# Patient Record
Sex: Female | Born: 1970
Health system: Southern US, Community
[De-identification: ages and names within clinical notes are randomized; demographics above are authoritative.]

## PROBLEM LIST (undated history)

## (undated) DIAGNOSIS — A64 Unspecified sexually transmitted disease: Secondary | ICD-10-CM

## (undated) DIAGNOSIS — R112 Nausea with vomiting, unspecified: Secondary | ICD-10-CM

## (undated) DIAGNOSIS — R7309 Other abnormal glucose: Secondary | ICD-10-CM

## (undated) DIAGNOSIS — K219 Gastro-esophageal reflux disease without esophagitis: Secondary | ICD-10-CM

## (undated) DIAGNOSIS — D649 Anemia, unspecified: Secondary | ICD-10-CM

## (undated) DIAGNOSIS — IMO0001 Reserved for inherently not codable concepts without codable children: Secondary | ICD-10-CM

## (undated) DIAGNOSIS — Z9889 Other specified postprocedural states: Secondary | ICD-10-CM

## (undated) DIAGNOSIS — E119 Type 2 diabetes mellitus without complications: Secondary | ICD-10-CM

## (undated) DIAGNOSIS — E669 Obesity, unspecified: Secondary | ICD-10-CM

## (undated) DIAGNOSIS — A630 Anogenital (venereal) warts: Secondary | ICD-10-CM

## (undated) DIAGNOSIS — F419 Anxiety disorder, unspecified: Secondary | ICD-10-CM

## (undated) DIAGNOSIS — J189 Pneumonia, unspecified organism: Secondary | ICD-10-CM

## (undated) HISTORY — DX: Unspecified sexually transmitted disease: A64

## (undated) HISTORY — DX: Reserved for inherently not codable concepts without codable children: IMO0001

## (undated) HISTORY — DX: Obesity, unspecified: E66.9

## (undated) HISTORY — DX: Gastro-esophageal reflux disease without esophagitis: K21.9

## (undated) HISTORY — DX: Anogenital (venereal) warts: A63.0

## (undated) HISTORY — DX: Anxiety disorder, unspecified: F41.9

## (undated) HISTORY — PX: OTHER SURGICAL HISTORY: SHX169

## (undated) HISTORY — DX: Other abnormal glucose: R73.09

## (undated) HISTORY — DX: Anemia, unspecified: D64.9

## (undated) HISTORY — PX: TONSILLECTOMY: SUR1361

---

## 1997-11-27 DIAGNOSIS — A64 Unspecified sexually transmitted disease: Secondary | ICD-10-CM

## 1997-11-27 HISTORY — DX: Unspecified sexually transmitted disease: A64

## 1998-03-31 DIAGNOSIS — E669 Obesity, unspecified: Secondary | ICD-10-CM

## 1998-03-31 HISTORY — DX: Obesity, unspecified: E66.9

## 1998-04-22 ENCOUNTER — Other Ambulatory Visit: Admission: RE | Admit: 1998-04-22 | Discharge: 1998-04-22 | Payer: Self-pay | Admitting: *Deleted

## 1998-05-31 HISTORY — PX: COLPOSCOPY: SHX161

## 1998-06-24 ENCOUNTER — Other Ambulatory Visit: Admission: RE | Admit: 1998-06-24 | Discharge: 1998-06-24 | Payer: Self-pay | Admitting: *Deleted

## 1998-10-01 ENCOUNTER — Other Ambulatory Visit: Admission: RE | Admit: 1998-10-01 | Discharge: 1998-10-01 | Payer: Self-pay | Admitting: *Deleted

## 1999-01-30 ENCOUNTER — Other Ambulatory Visit: Admission: RE | Admit: 1999-01-30 | Discharge: 1999-01-30 | Payer: Self-pay | Admitting: *Deleted

## 1999-04-24 ENCOUNTER — Emergency Department (HOSPITAL_COMMUNITY): Admission: EM | Admit: 1999-04-24 | Discharge: 1999-04-24 | Payer: Self-pay | Admitting: Emergency Medicine

## 1999-04-24 ENCOUNTER — Encounter: Admission: RE | Admit: 1999-04-24 | Discharge: 1999-04-24 | Payer: Self-pay | Admitting: *Deleted

## 1999-06-02 ENCOUNTER — Other Ambulatory Visit: Admission: RE | Admit: 1999-06-02 | Discharge: 1999-06-02 | Payer: Self-pay | Admitting: Obstetrics and Gynecology

## 1999-12-01 ENCOUNTER — Other Ambulatory Visit: Admission: RE | Admit: 1999-12-01 | Discharge: 1999-12-01 | Payer: Self-pay | Admitting: *Deleted

## 2000-05-24 ENCOUNTER — Emergency Department (HOSPITAL_COMMUNITY): Admission: EM | Admit: 2000-05-24 | Discharge: 2000-05-24 | Payer: Self-pay | Admitting: Emergency Medicine

## 2000-05-31 ENCOUNTER — Other Ambulatory Visit: Admission: RE | Admit: 2000-05-31 | Discharge: 2000-05-31 | Payer: Self-pay | Admitting: *Deleted

## 2001-07-06 ENCOUNTER — Other Ambulatory Visit: Admission: RE | Admit: 2001-07-06 | Discharge: 2001-07-06 | Payer: Self-pay | Admitting: *Deleted

## 2002-03-13 ENCOUNTER — Encounter: Payer: Self-pay | Admitting: Emergency Medicine

## 2002-03-13 ENCOUNTER — Emergency Department (HOSPITAL_COMMUNITY): Admission: EM | Admit: 2002-03-13 | Discharge: 2002-03-13 | Payer: Self-pay | Admitting: Emergency Medicine

## 2002-07-11 ENCOUNTER — Other Ambulatory Visit: Admission: RE | Admit: 2002-07-11 | Discharge: 2002-07-11 | Payer: Self-pay | Admitting: Obstetrics and Gynecology

## 2002-10-13 ENCOUNTER — Encounter: Payer: Self-pay | Admitting: *Deleted

## 2002-10-13 ENCOUNTER — Encounter: Admission: RE | Admit: 2002-10-13 | Discharge: 2002-10-13 | Payer: Self-pay | Admitting: *Deleted

## 2003-01-15 ENCOUNTER — Other Ambulatory Visit: Admission: RE | Admit: 2003-01-15 | Discharge: 2003-01-15 | Payer: Self-pay | Admitting: *Deleted

## 2003-07-17 ENCOUNTER — Other Ambulatory Visit: Admission: RE | Admit: 2003-07-17 | Discharge: 2003-07-17 | Payer: Self-pay | Admitting: Obstetrics and Gynecology

## 2003-07-18 ENCOUNTER — Ambulatory Visit (HOSPITAL_COMMUNITY): Admission: RE | Admit: 2003-07-18 | Discharge: 2003-07-18 | Payer: Self-pay | Admitting: *Deleted

## 2003-07-21 ENCOUNTER — Emergency Department (HOSPITAL_COMMUNITY): Admission: EM | Admit: 2003-07-21 | Discharge: 2003-07-21 | Payer: Self-pay | Admitting: Emergency Medicine

## 2003-08-27 ENCOUNTER — Ambulatory Visit (HOSPITAL_COMMUNITY): Admission: RE | Admit: 2003-08-27 | Discharge: 2003-08-27 | Payer: Self-pay | Admitting: Obstetrics and Gynecology

## 2003-10-16 ENCOUNTER — Ambulatory Visit (HOSPITAL_COMMUNITY): Admission: RE | Admit: 2003-10-16 | Discharge: 2003-10-16 | Payer: Self-pay | Admitting: Obstetrics and Gynecology

## 2003-12-07 ENCOUNTER — Inpatient Hospital Stay (HOSPITAL_COMMUNITY): Admission: AD | Admit: 2003-12-07 | Discharge: 2003-12-07 | Payer: Self-pay | Admitting: Obstetrics and Gynecology

## 2004-02-25 ENCOUNTER — Ambulatory Visit (HOSPITAL_COMMUNITY): Admission: RE | Admit: 2004-02-25 | Discharge: 2004-02-25 | Payer: Self-pay | Admitting: Obstetrics and Gynecology

## 2004-03-02 ENCOUNTER — Inpatient Hospital Stay (HOSPITAL_COMMUNITY): Admission: AD | Admit: 2004-03-02 | Discharge: 2004-03-04 | Payer: Self-pay | Admitting: Obstetrics and Gynecology

## 2004-03-07 ENCOUNTER — Encounter: Admission: RE | Admit: 2004-03-07 | Discharge: 2004-04-06 | Payer: Self-pay | Admitting: Obstetrics and Gynecology

## 2004-04-07 ENCOUNTER — Encounter: Admission: RE | Admit: 2004-04-07 | Discharge: 2004-05-07 | Payer: Self-pay | Admitting: Obstetrics and Gynecology

## 2004-07-17 ENCOUNTER — Other Ambulatory Visit: Admission: RE | Admit: 2004-07-17 | Discharge: 2004-07-17 | Payer: Self-pay | Admitting: Obstetrics and Gynecology

## 2005-08-04 ENCOUNTER — Other Ambulatory Visit: Admission: RE | Admit: 2005-08-04 | Discharge: 2005-08-04 | Payer: Self-pay | Admitting: Obstetrics and Gynecology

## 2005-08-17 ENCOUNTER — Encounter: Admission: RE | Admit: 2005-08-17 | Discharge: 2005-08-17 | Payer: Self-pay | Admitting: Obstetrics and Gynecology

## 2006-08-05 ENCOUNTER — Other Ambulatory Visit: Admission: RE | Admit: 2006-08-05 | Discharge: 2006-08-05 | Payer: Self-pay | Admitting: Obstetrics & Gynecology

## 2006-09-12 ENCOUNTER — Emergency Department (HOSPITAL_COMMUNITY): Admission: EM | Admit: 2006-09-12 | Discharge: 2006-09-12 | Payer: Self-pay | Admitting: Family Medicine

## 2007-06-10 ENCOUNTER — Encounter: Admission: RE | Admit: 2007-06-10 | Discharge: 2007-06-10 | Payer: Self-pay | Admitting: *Deleted

## 2007-08-08 ENCOUNTER — Other Ambulatory Visit: Admission: RE | Admit: 2007-08-08 | Discharge: 2007-08-08 | Payer: Self-pay | Admitting: Obstetrics and Gynecology

## 2008-08-08 ENCOUNTER — Other Ambulatory Visit: Admission: RE | Admit: 2008-08-08 | Discharge: 2008-08-08 | Payer: Self-pay | Admitting: Obstetrics and Gynecology

## 2008-10-07 ENCOUNTER — Emergency Department (HOSPITAL_COMMUNITY): Admission: EM | Admit: 2008-10-07 | Discharge: 2008-10-07 | Payer: Self-pay | Admitting: Family Medicine

## 2011-10-05 ENCOUNTER — Other Ambulatory Visit: Payer: Self-pay | Admitting: Obstetrics and Gynecology

## 2011-10-05 DIAGNOSIS — Z1231 Encounter for screening mammogram for malignant neoplasm of breast: Secondary | ICD-10-CM

## 2011-10-12 ENCOUNTER — Ambulatory Visit
Admission: RE | Admit: 2011-10-12 | Discharge: 2011-10-12 | Disposition: A | Discharge status: Home / Self Care | Payer: 59 | Source: Ambulatory Visit | Attending: Obstetrics and Gynecology | Admitting: Obstetrics and Gynecology

## 2011-10-12 DIAGNOSIS — Z1231 Encounter for screening mammogram for malignant neoplasm of breast: Secondary | ICD-10-CM

## 2011-12-14 LAB — HM PAP SMEAR: HM Pap smear: NORMAL

## 2012-09-26 ENCOUNTER — Other Ambulatory Visit: Payer: Self-pay | Admitting: Obstetrics and Gynecology

## 2012-09-26 DIAGNOSIS — Z1231 Encounter for screening mammogram for malignant neoplasm of breast: Secondary | ICD-10-CM

## 2012-10-17 ENCOUNTER — Ambulatory Visit
Admission: RE | Admit: 2012-10-17 | Discharge: 2012-10-17 | Disposition: A | Discharge status: Home / Self Care | Payer: 59 | Source: Ambulatory Visit | Attending: Obstetrics and Gynecology | Admitting: Obstetrics and Gynecology

## 2012-10-17 DIAGNOSIS — Z1231 Encounter for screening mammogram for malignant neoplasm of breast: Secondary | ICD-10-CM

## 2012-10-17 LAB — HM MAMMOGRAPHY: HM Mammogram: NORMAL

## 2012-12-20 ENCOUNTER — Encounter: Payer: Self-pay | Admitting: *Deleted

## 2012-12-22 ENCOUNTER — Ambulatory Visit (INDEPENDENT_AMBULATORY_CARE_PROVIDER_SITE_OTHER): Payer: 59 | Admitting: Nurse Practitioner

## 2012-12-22 ENCOUNTER — Encounter: Payer: Self-pay | Admitting: Nurse Practitioner

## 2012-12-22 ENCOUNTER — Ambulatory Visit (INDEPENDENT_AMBULATORY_CARE_PROVIDER_SITE_OTHER): Payer: 59 | Admitting: Obstetrics & Gynecology

## 2012-12-22 VITALS — BP 138/70 | HR 78 | Ht 69.0 in | Wt 390.8 lb

## 2012-12-22 DIAGNOSIS — N9089 Other specified noninflammatory disorders of vulva and perineum: Secondary | ICD-10-CM

## 2012-12-22 DIAGNOSIS — Z01419 Encounter for gynecological examination (general) (routine) without abnormal findings: Secondary | ICD-10-CM

## 2012-12-22 DIAGNOSIS — Z0000 Encounter for general adult medical examination without abnormal findings: Secondary | ICD-10-CM

## 2012-12-22 LAB — POCT URINALYSIS DIPSTICK
Leukocytes, UA: NEGATIVE
Spec Grav, UA: 1.015
Urobilinogen, UA: NEGATIVE
pH, UA: 7

## 2012-12-22 LAB — HEMOGLOBIN, FINGERSTICK: Hemoglobin, fingerstick: 12.4 g/dL (ref 12.0–16.0)

## 2012-12-22 NOTE — Patient Instructions (Signed)

## 2012-12-22 NOTE — Progress Notes (Signed)
42 y.o. Single Caucasain female G1P1 here for AEX with Shirlyn Goltz.  Several multiple lesions on inner thighs noted.  She has a history of skin tags but one area in particular is very sensitive to the patient.  I looked at it with Shirlyn Goltz and recommended a vulvar biopsy.    Procedure described to patient.  Informed consent obtained.  All questions answered before proceeding.    Procedure:  Area cleansed with Betadine.  Sterile technique used throughout procedure.  Skin anesthestized with Lidocaine 1% plain; 0.40mL. Lot#[id number], Exp 10/29/13.  Lesions grasped with single pickups and excised with sterile scissors.  One figure of eight suture of 4.0 vicryl used to close incision and provide hemostasis.  Adequate hemostasis noted.  Dressing was applied.     Specimen(s) labeled as right inner thigh and sent to pathology  Assessment:  Vulvar lesion     Plan:  Patient will be notified of pathology results via phone and additional recommendations will be made at that time.

## 2012-12-22 NOTE — Patient Instructions (Signed)

## 2012-12-22 NOTE — Progress Notes (Signed)
42 y.o. G1P1 Single Caucasian Fe here for annual exam.  Menses for 3.5 days. moderate to light, no cramps. Some PMS. Not sexually active.  She has noted an increase in irritation of the upper thighs that she thought was irritation of skin tags. Her ex husband was without a job first of year along with the furnace going out; and that caused some financial worries but he is working now.   Now furnace is fixed.  No LMP recorded.          Sexually active: no  The current method of family planning is none.    Exercising: no  The patient does not participate in regular exercise at present. Smoker:  no  Health Maintenance: Pap:  12/14/2011 normal with neg HR HPV  MMG:  10/17/2012 normal TDaP:  05/2011 Labs: Hgb-   reports that she has never smoked. She does not have any smokeless tobacco history on file.  Past Medical History  Diagnosis Date  . Obesity 03/1998  . STD (sexually transmitted disease) 11/27/97    Hx of Chlamydia    Past Surgical History  Procedure Laterality Date  . Colposcopy  05/1998    neg ECC, neg Bx    Current Outpatient Prescriptions  Medication Sig Dispense Refill  . Calcium-Magnesium-Vitamin D (CALCIUM 500 PO) Take by mouth.      . Cetirizine HCl 10 MG CAPS Take by mouth.      . Cholecalciferol (VITAMIN D) 2000 UNITS CAPS Take by mouth.      . Citalopram Hydrobromide (CELEXA PO) Take by mouth. NEED TO KNOW HER DOSAGE      . hydrochlorothiazide (HYDRODIURIL) 25 MG tablet Take 25 mg by mouth daily.      Marland Kitchen LORazepam (ATIVAN) 1 MG tablet Take 1 mg by mouth every 8 (eight) hours. PRN       No current facility-administered medications for this visit.    Family History  Problem Relation Age of Onset  . Heart disease Father   . Multiple births Maternal Grandmother   . Hypertension Maternal Grandmother   . Multiple births Paternal Grandmother   . Diabetes Paternal Grandmother   . Heart disease Paternal Grandfather     ROS:  Pertinent items are noted in HPI.   Otherwise, a comprehensive ROS was negative.  Exam:   There were no vitals taken for this visit.     Ht Readings from Last 3 Encounters:  No data found for Ht    General appearance: alert, cooperative and appears stated age Head: Normocephalic, without obvious abnormality, atraumatic Neck: no adenopathy, supple, symmetrical, trachea midline and thyroid normal to inspection and palpation Lungs: clear to auscultation bilaterally Breasts: normal appearance, no masses or tenderness Heart: regular rate and rhythm Abdomen: soft, non-tender; obese, no masses,  no organomegaly Extremities: extremities normal, atraumatic, no cyanosis or edema Skin: Skin color, texture, turgor normal. No rashes or lesions Lymph nodes: Cervical, supraclavicular, and axillary nodes normal. No abnormal inguinal nodes palpated Neurologic: Grossly normal   Pelvic: External genitalia:  multiple lesions that look like clusters of skin tags in the groin.  One area on the right is irritated and ? Condyloma.  Pt. Was seen with Dr. Hyacinth Meeker.              Urethra:  normal appearing urethra with no masses, tenderness or lesions              Bartholin's and Skene's: normal  Vagina: normal appearing vagina with normal color and discharge, no lesions              Cervix: anteverted              Pap taken: no Bimanual Exam:  Uterus:  normal limited with body habitus.              Adnexa: no mass, fullness, tenderness               Rectovaginal: Confirms               Anus:  normal sphincter tone, no lesions  A:  Well Woman with normal exam  Morbid obesity  Skin tags inguinal area - that needs a biopsy for correct diagnosis  P:   Mammogram due 2/15  pap smear as per guidelines  Dr. Hyacinth Meeker did biopsy of lesion and will follow with results.  return annually or prn  An After Visit Summary was printed and given to the patient.  Patient seen personally.  Note reviewed.  Agree with plan of care.  MSM

## 2012-12-26 LAB — IPS CERVICAL/ECC/EMB/VULVAR/VAGINAL BIOPSY

## 2013-01-04 ENCOUNTER — Ambulatory Visit (INDEPENDENT_AMBULATORY_CARE_PROVIDER_SITE_OTHER): Payer: 59 | Admitting: Obstetrics & Gynecology

## 2013-01-04 VITALS — BP 128/80 | HR 80 | Resp 20

## 2013-01-04 DIAGNOSIS — A630 Anogenital (venereal) warts: Secondary | ICD-10-CM

## 2013-01-05 ENCOUNTER — Encounter: Payer: Self-pay | Admitting: Obstetrics & Gynecology

## 2013-01-05 NOTE — Patient Instructions (Signed)
We will call and schedule your procedures.

## 2013-01-05 NOTE — Progress Notes (Signed)
42 y.o. SWF here for discussion of pathology results and to discuss plan.  Patient seen here 12/22/12 by NP, Shirlyn Goltz, for AEX.  Patient reported area of significant irritation on right inner thigh.  I was asked to look at it.  I thought it was an inflamed skin tag as the lesion is located in an area where her thighs rub with ambulation due to obesity.  Lesion was excised and sent to pathology.  Results showed condyloma.  Patient here for discussion.  Patient and I discussed results.  She has many questions as she has not been sexually active in about two years and her HR HPV testing on Pap was negative.  Discussed with pt difference between HR HPV and low risk HPV as well as natural history of disease.  All questions answered.  Treatment discussion.  Due to her obesity and number of inner thigh lesions present, I feel treatment may be difficult and require multiple visits.  Aldara, cryo, TCA, excisions, and laser ablation of lesions discussed.  Patient aware I will not be able to make inner thighs completely normal due to the skin thickening from inner thigh rubbing.  Voices understanding.    O: Healthy obese WD,WN female Affect: normal Skin: right inner thigh well healed.  No suture present.  A:Condyloma  P: Pt will return for cryo to inner thigh lesions and then aldara treatment.  Patient in agreement with plan.  ~15 minutes spent with patient >50% of time was in face to face discussion of above.

## 2013-07-06 ENCOUNTER — Other Ambulatory Visit: Payer: Self-pay

## 2013-07-15 ENCOUNTER — Emergency Department (HOSPITAL_COMMUNITY)
Admission: EM | Admit: 2013-07-15 | Discharge: 2013-07-15 | Disposition: A | Discharge status: Home / Self Care | Payer: 59 | Source: Home / Self Care | Attending: Emergency Medicine | Admitting: Emergency Medicine

## 2013-07-15 ENCOUNTER — Encounter (HOSPITAL_COMMUNITY): Payer: Self-pay | Admitting: Emergency Medicine

## 2013-07-15 DIAGNOSIS — M6790 Unspecified disorder of synovium and tendon, unspecified site: Secondary | ICD-10-CM

## 2013-07-15 DIAGNOSIS — M67971 Unspecified disorder of synovium and tendon, right ankle and foot: Secondary | ICD-10-CM

## 2013-07-15 MED ORDER — DICLOFENAC SODIUM 75 MG PO TBEC: 75.0000 mg | DELAYED_RELEASE_TABLET | Freq: Two times a day (BID) | ORAL | Status: DC

## 2013-07-15 NOTE — ED Provider Notes (Signed)
CSN: [id number]     Arrival date & time 07/15/13  1127 History   First MD Initiated Contact with Patient 07/15/13 1317     Chief Complaint  Patient presents with  . Foot Pain    Right    (Consider location/radiation/quality/duration/timing/severity/associated sxs/prior Treatment) HPI Patient is a 42 yo F presenting with right posterior heel pain. She states it has been going on for a few months, unchanged. Worse with walking or putting pressure on foot. Pain is around achilles tendon, does not radiate into heel or bottom of foot. Pain is present at all times. She does not notice a difference based on which shoes she is has on. She has been doing full motion stretches for ankle/achilles and that made it a lot worse. Ice, heat, rest, ibuprofen have been tried but did not help. Nothing makes it better. Never had anything like this before.   Boneta Lucks at [facility] is PCP.  Past Medical History  Diagnosis Date  . Obesity 03/1998  . STD (sexually transmitted disease) 11/27/97    Hx of Chlamydia   Past Surgical History  Procedure Laterality Date  . Colposcopy  05/1998    neg ECC, neg Bx   Family History  Problem Relation Age of Onset  . Heart disease Father   . Multiple births Maternal Grandmother   . Hypertension Maternal Grandmother   . Multiple births Paternal Grandmother   . Diabetes Paternal Grandmother   . Heart disease Paternal Grandfather    History  Substance Use Topics  . Smoking status: Never Smoker   . Smokeless tobacco: Never Used  . Alcohol Use: No   OB History   Grav Para Term Preterm Abortions TAB SAB Ect Mult Living   1 1             Review of Systems  Constitutional: Negative for fever and chills.  HENT: Negative for congestion.   Eyes: Negative for visual disturbance.  Respiratory: Negative for cough and shortness of breath.   Cardiovascular: Negative for chest pain and leg swelling.  Gastrointestinal: Negative for abdominal pain.  Genitourinary:  Negative for dysuria.  Musculoskeletal: Positive for arthralgias, gait problem and myalgias.  Skin: Negative for rash.  Neurological: Negative for headaches.    Allergies  Review of patient's allergies indicates no known allergies.  Home Medications   Current Outpatient Rx  Name  Route  Sig  Dispense  Refill  . Calcium-Magnesium-Vitamin D (CALCIUM 500 PO)   Oral   Take by mouth.         . Cetirizine HCl 10 MG CAPS   Oral   Take 10 mg by mouth as needed.          . Cholecalciferol (VITAMIN D) 2000 UNITS CAPS   Oral   Take by mouth.         . Citalopram Hydrobromide (CELEXA PO)   Oral   Take 40 mg by mouth daily.          . hydrochlorothiazide (HYDRODIURIL) 25 MG tablet   Oral   Take 25 mg by mouth daily.         Marland Kitchen LORazepam (ATIVAN) 1 MG tablet   Oral   Take 1 mg by mouth every 8 (eight) hours. PRN         . diclofenac (VOLTAREN) 75 MG EC tablet   Oral   Take 1 tablet (75 mg total) by mouth 2 (two) times daily.   30 tablet   0  LMP 07/08/2013 Physical Exam  Constitutional: She is oriented to person, place, and time. She appears well-developed and well-nourished. No distress.  Morbidly obese female  HENT:  Head: Normocephalic and atraumatic.  Neck: Normal range of motion.  Cardiovascular: Normal rate, regular rhythm and normal heart sounds.   Pulmonary/Chest: Effort normal and breath sounds normal. No respiratory distress. She has no wheezes.  Abdominal: Soft. She exhibits no distension.  Musculoskeletal: Normal range of motion. She exhibits no edema.       Left ankle: She exhibits normal pulse. Achilles tendon exhibits pain (TTP of insertion site.). Achilles tendon exhibits no defect.  Neurological: She is alert and oriented to person, place, and time.  Skin: Skin is warm and dry. No rash noted.  Psychiatric: She has a normal mood and affect.    ED Course  Procedures (including critical care time) Labs Review Labs Reviewed - No data to  display Imaging Review No results found.   MDM   1. Achilles tendon disorder, right    No known injury. Encouraged weight loss. Given Rx for Diclofenac 75mg  BID for antiinflammatory Eccentric exercises Ice to area Heel cups for shoes F/u at sports medicine clinic for better evaluation.  Hilarie Fredrickson, MD 07/15/13 1420

## 2013-07-15 NOTE — ED Notes (Signed)
C/o right heel pain x a "few" months , has tried ice, heat and rest

## 2013-07-17 ENCOUNTER — Ambulatory Visit (INDEPENDENT_AMBULATORY_CARE_PROVIDER_SITE_OTHER): Payer: 59 | Admitting: Family Medicine

## 2013-07-17 VITALS — BP 142/87 | HR 77 | Ht 69.0 in | Wt 380.0 lb

## 2013-07-17 DIAGNOSIS — M25579 Pain in unspecified ankle and joints of unspecified foot: Secondary | ICD-10-CM

## 2013-07-17 DIAGNOSIS — M25571 Pain in right ankle and joints of right foot: Secondary | ICD-10-CM

## 2013-07-17 DIAGNOSIS — M7660 Achilles tendinitis, unspecified leg: Secondary | ICD-10-CM

## 2013-07-17 DIAGNOSIS — M7661 Achilles tendinitis, right leg: Secondary | ICD-10-CM

## 2013-07-17 MED ORDER — NITROGLYCERIN 0.2 MG/HR TD PT24: MEDICATED_PATCH | TRANSDERMAL | Status: DC

## 2013-07-17 NOTE — ED Provider Notes (Signed)
Medical screening examination/treatment/procedure(s) were performed by a resident physician or non-physician practitioner and as the supervising physician I was immediately available for consultation/collaboration.  Levette Paulick, MD    Matalie Romberger S Zalyn Amend, MD 07/17/13 0743 

## 2013-07-17 NOTE — Patient Instructions (Signed)
You have achilles tendinitis. Tylenol or aleve as needed for pain Lowering/raise on a step exercises 3 x 10 once or twice a day - two feet first then one (start by doing 3 sets of 5 on level ground and work your way up as we discussed). Can add heel walks, toe walks forward and backward as well Ice bucket 10-15 minutes at end of day - can ice 3-4 times a day. Avoid uneven ground, hills as much as possible. Heel lifts in all shoes.  Avoid barefoot walking. Consider Dr. Jari Sportsman active series insoles with the lifts we provided. Consider Physical therapy Nitro patches 1/4 of 0.2mg /hr patch over affected achilles and change daily Follow up with me in 1 month.

## 2013-07-18 ENCOUNTER — Encounter: Payer: Self-pay | Admitting: Family Medicine

## 2013-07-18 DIAGNOSIS — M7661 Achilles tendinitis, right leg: Secondary | ICD-10-CM | POA: Insufficient documentation

## 2013-07-18 NOTE — Progress Notes (Signed)
Patient ID: Debra French, female   DOB: 06/01/1971, 42 y.o.   MRN: [id number]  PCP: No PCP Per Patient  Subjective:   HPI: Patient is a 42 y.o. female here for right heel pain.  Patient denies known injury. Patient reports she's had off and on right posterior heel pain for a year. If walks a lot will get severe pain here for 3-4 days afterwards. Tried heat, ice, resting, ibuprofen only.  Past Medical History  Diagnosis Date  . Obesity 03/1998  . STD (sexually transmitted disease) 11/27/97    Hx of Chlamydia    Current Outpatient Prescriptions on File Prior to Visit  Medication Sig Dispense Refill  . Calcium-Magnesium-Vitamin D (CALCIUM 500 PO) Take by mouth.      . Cholecalciferol (VITAMIN D) 2000 UNITS CAPS Take by mouth.      . Citalopram Hydrobromide (CELEXA PO) Take 40 mg by mouth daily.       Marland Kitchen LORazepam (ATIVAN) 1 MG tablet Take 1 mg by mouth every 8 (eight) hours. PRN      . Cetirizine HCl 10 MG CAPS Take 10 mg by mouth as needed.       . diclofenac (VOLTAREN) 75 MG EC tablet Take 1 tablet (75 mg total) by mouth 2 (two) times daily.  30 tablet  0  . hydrochlorothiazide (HYDRODIURIL) 25 MG tablet Take 25 mg by mouth daily.       No current facility-administered medications on file prior to visit.    Past Surgical History  Procedure Laterality Date  . Colposcopy  05/1998    neg ECC, neg Bx    No Known Allergies  History   Social History  . Marital Status: Single    Spouse Name: N/A    Number of Children: N/A  . Years of Education: N/A   Occupational History  . Not on file.   Social History Main Topics  . Smoking status: Never Smoker   . Smokeless tobacco: Never Used  . Alcohol Use: No  . Drug Use: No  . Sexual Activity: No   Other Topics Concern  . Not on file   Social History Narrative  . No narrative on file    Family History  Problem Relation Age of Onset  . Heart disease Father   . Hyperlipidemia Father   . Multiple births Maternal  Grandmother   . Hypertension Maternal Grandmother   . Multiple births Paternal Grandmother   . Diabetes Paternal Grandmother   . Heart disease Paternal Grandfather   . Heart attack Paternal Grandfather   . Hyperlipidemia Sister     BP 142/87  Pulse 77  Ht 5\' 9"  (1.753 m)  Wt 380 lb (172.367 kg)  BMI 56.09 kg/m2  LMP 07/08/2013  Review of Systems: See HPI above.    Objective:  Physical Exam:  Gen: NAD  Right foot/ankle: No gross deformity, swelling, ecchymoses FROM with pain on passive dorsiflexion.  Pain with resisted plantarflexion. TTP achilles just proximal to and at insertion on calcaneus. Negative ant drawer and talar tilt.   Negative syndesmotic compression. Thompsons test negative. NV intact distally.  MSK u/s: calcifications with thickening to over 0.8cm at insertion of achilles tendon on calcaneus.    Assessment & Plan:  1. Right achilles tendinopathy - start with home exercise program which was reviewed today.  Heel lifts provided.  Given length of symptoms will add nitro patches at this time (discussed headaches, adhesive reaction).  Icing, avoid uneven ground and barefoot walking.  Consider formal physical therapy.  F/u in 1 month.

## 2013-07-18 NOTE — Assessment & Plan Note (Signed)
start with home exercise program which was reviewed today.  Heel lifts provided.  Given length of symptoms will add nitro patches at this time (discussed headaches, adhesive reaction).  Icing, avoid uneven ground and barefoot walking.  Consider formal physical therapy.  F/u in 1 month.

## 2013-08-16 ENCOUNTER — Encounter: Payer: Self-pay | Admitting: Family Medicine

## 2013-08-16 ENCOUNTER — Ambulatory Visit (INDEPENDENT_AMBULATORY_CARE_PROVIDER_SITE_OTHER): Payer: 59 | Admitting: Family Medicine

## 2013-08-16 VITALS — BP 166/96 | HR 79 | Ht 69.0 in | Wt 385.0 lb

## 2013-08-16 DIAGNOSIS — M7660 Achilles tendinitis, unspecified leg: Secondary | ICD-10-CM

## 2013-08-16 DIAGNOSIS — M7661 Achilles tendinitis, right leg: Secondary | ICD-10-CM

## 2013-08-16 NOTE — Patient Instructions (Signed)
Tylenol or aleve as needed for pain Continue step exercise, stretching every day. Heel walks, toe walks forward and backward as well Ice bucket 10-15 minutes at end of day - can ice 3-4 times a day. Avoid uneven ground, hills as much as possible. Heel lifts in all shoes.  Avoid barefoot walking. Increase nitro patches to 1/2 a patch and change daily. Consider Physical therapy Consider custom orthotics - call and schedule appointment specifically for orthotics if you want to go ahead with this (usually takes about 45 minutes).

## 2013-08-18 ENCOUNTER — Encounter: Payer: Self-pay | Admitting: Family Medicine

## 2013-08-18 NOTE — Progress Notes (Signed)
Patient ID: Debra French, female   DOB: 08-Oct-1970, 42 y.o.   MRN: [id number]  PCP: No PCP Per Patient  Subjective:   HPI: Patient is a 42 y.o. female here for right heel pain.  11/17: Patient denies known injury. Patient reports she's had off and on right posterior heel pain for a year. If walks a lot will get severe pain here for 3-4 days afterwards. Tried heat, ice, resting, ibuprofen only.  12/17: Continues with 6/10 level pain of posterior heel. Doing HEP, icing, using heel lifts, nitro. Taking ibuprofen.  Past Medical History  Diagnosis Date  . Obesity 03/1998  . STD (sexually transmitted disease) 11/27/97    Hx of Chlamydia    Current Outpatient Prescriptions on File Prior to Visit  Medication Sig Dispense Refill  . Calcium-Magnesium-Vitamin D (CALCIUM 500 PO) Take by mouth.      . Cetirizine HCl 10 MG CAPS Take 10 mg by mouth as needed.       . Cholecalciferol (VITAMIN D) 2000 UNITS CAPS Take by mouth.      . Citalopram Hydrobromide (CELEXA PO) Take 40 mg by mouth daily.       . diclofenac (VOLTAREN) 75 MG EC tablet Take 1 tablet (75 mg total) by mouth 2 (two) times daily.  30 tablet  0  . hydrochlorothiazide (HYDRODIURIL) 25 MG tablet Take 25 mg by mouth daily.      Marland Kitchen LORazepam (ATIVAN) 1 MG tablet Take 1 mg by mouth every 8 (eight) hours. PRN      . nitroGLYCERIN (NITRODUR - DOSED IN MG/24 HR) 0.2 mg/hr patch Apply 1/4 patch to affected achilles and change daily  30 patch  1   No current facility-administered medications on file prior to visit.    Past Surgical History  Procedure Laterality Date  . Colposcopy  05/1998    neg ECC, neg Bx    No Known Allergies  History   Social History  . Marital Status: Single    Spouse Name: N/A    Number of Children: N/A  . Years of Education: N/A   Occupational History  . Not on file.   Social History Main Topics  . Smoking status: Never Smoker   . Smokeless tobacco: Never Used  . Alcohol Use: No  .  Drug Use: No  . Sexual Activity: No   Other Topics Concern  . Not on file   Social History Narrative  . No narrative on file    Family History  Problem Relation Age of Onset  . Heart disease Father   . Hyperlipidemia Father   . Multiple births Maternal Grandmother   . Hypertension Maternal Grandmother   . Multiple births Paternal Grandmother   . Diabetes Paternal Grandmother   . Heart disease Paternal Grandfather   . Heart attack Paternal Grandfather   . Hyperlipidemia Sister     BP 166/96  Pulse 79  Ht 5\' 9"  (1.753 m)  Wt 385 lb (174.635 kg)  BMI 56.83 kg/m2  Review of Systems: See HPI above.    Objective:  Physical Exam:  Gen: NAD  Right foot/ankle: No gross deformity, swelling, ecchymoses FROM with pain on passive dorsiflexion.  Pain with resisted plantarflexion. TTP achilles just proximal to and at insertion on calcaneus. Negative ant drawer and talar tilt.   Negative syndesmotic compression. Thompsons test negative. NV intact distally.    Assessment & Plan:  1. Right achilles tendinopathy - Continue home exercises.  Increase nitro to 1/2 patch.  Continue heel lifts, icing, nsaids as needed.  Consider custom orthotics and physical therapy.

## 2013-08-18 NOTE — Assessment & Plan Note (Signed)
Continue home exercises.  Increase nitro to 1/2 patch.  Continue heel lifts, icing, nsaids as needed.  Consider custom orthotics and physical therapy.

## 2013-08-28 ENCOUNTER — Encounter: Payer: Self-pay | Admitting: *Deleted

## 2013-09-27 ENCOUNTER — Other Ambulatory Visit: Payer: Self-pay

## 2013-09-27 DIAGNOSIS — Z1231 Encounter for screening mammogram for malignant neoplasm of breast: Secondary | ICD-10-CM

## 2013-10-23 ENCOUNTER — Ambulatory Visit
Admission: RE | Admit: 2013-10-23 | Discharge: 2013-10-23 | Disposition: A | Discharge status: Home / Self Care | Payer: 59 | Source: Ambulatory Visit

## 2013-10-23 DIAGNOSIS — Z1231 Encounter for screening mammogram for malignant neoplasm of breast: Secondary | ICD-10-CM

## 2013-11-01 ENCOUNTER — Encounter: Payer: Self-pay | Admitting: Obstetrics & Gynecology

## 2013-11-09 ENCOUNTER — Telehealth: Payer: Self-pay | Admitting: Nurse Practitioner

## 2013-11-09 NOTE — Telephone Encounter (Signed)
Patient has a question about a bill.

## 2013-12-26 ENCOUNTER — Encounter: Payer: Self-pay | Admitting: Nurse Practitioner

## 2013-12-26 ENCOUNTER — Ambulatory Visit (INDEPENDENT_AMBULATORY_CARE_PROVIDER_SITE_OTHER): Payer: 59 | Admitting: Nurse Practitioner

## 2013-12-26 VITALS — BP 126/82 | HR 72 | Ht 68.75 in | Wt 382.0 lb

## 2013-12-26 DIAGNOSIS — Z01419 Encounter for gynecological examination (general) (routine) without abnormal findings: Secondary | ICD-10-CM

## 2013-12-26 DIAGNOSIS — Z0000 Encounter for general adult medical examination without abnormal findings: Secondary | ICD-10-CM

## 2013-12-26 LAB — HEMOGLOBIN, FINGERSTICK: Hemoglobin, fingerstick: 12.8 g/dL (ref 12.0–16.0)

## 2013-12-26 NOTE — Progress Notes (Signed)
Patient ID: Debra French, female   DOB: Aug 10, 1971, 43 y.o.   MRN: [id number] 43 y.o. G1P1 Single Caucasian Fe here for annual exam. Menses is regular last about 5 days.  No cramps or PMS.  Not SA for 2 years.   Patient's last menstrual period was 12/23/2013.          Sexually active: no  The current method of family planning is abstinence.    Exercising: no  The patient does not participate in regular exercise at present. Smoker:  no  Health Maintenance: Pap:  12/14/11, WNL, neg HR HPV MMG:  10/23/13, Bi-Rads 1: negative TDaP:  05/01/10 Labs:  HB:  12.8 Urine:  Unable to void   reports that she has never smoked. She has never used smokeless tobacco. She reports that she does not drink alcohol or use illicit drugs.  Past Medical History  Diagnosis Date  . Obesity 03/1998  . STD (sexually transmitted disease) 11/27/97    Hx of Chlamydia    Past Surgical History  Procedure Laterality Date  . Colposcopy  05/1998    neg ECC, neg Bx    Current Outpatient Prescriptions  Medication Sig Dispense Refill  . Calcium-Magnesium-Vitamin D (CALCIUM 500 PO) Take by mouth.      . Cetirizine HCl 10 MG CAPS Take 10 mg by mouth as needed.       . Cholecalciferol (VITAMIN D-3) 5000 UNITS TABS Take 1 tablet by mouth daily.      . Citalopram Hydrobromide (CELEXA PO) Take 40 mg by mouth daily.       . famotidine (PEPCID) 20 MG tablet Take 20 mg by mouth daily.      . ferrous sulfate 325 (65 FE) MG EC tablet Take 325 mg by mouth daily with breakfast.      . LORazepam (ATIVAN) 1 MG tablet Take 1 mg by mouth every 8 (eight) hours. PRN      . Multiple Vitamin (MULTIVITAMIN) tablet Take 1 tablet by mouth daily.      . phenylephrine (SUDAFED PE) 10 MG TABS tablet Take 10 mg by mouth daily.      . nitroGLYCERIN (NITRODUR - DOSED IN MG/24 HR) 0.2 mg/hr patch Apply 1/4 patch to affected achilles and change daily  30 patch  1   No current facility-administered medications for this visit.    Family  History  Problem Relation Age of Onset  . Heart disease Father   . Hyperlipidemia Father   . Multiple births Maternal Grandmother   . Hypertension Maternal Grandmother   . Multiple births Paternal Grandmother   . Diabetes Paternal Grandmother   . Heart disease Paternal Grandfather   . Heart attack Paternal Grandfather   . Hyperlipidemia Sister     ROS:  Pertinent items are noted in HPI.  Otherwise, a comprehensive ROS was negative.  Exam:   BP 126/82  Pulse 72  Ht 5' 8.75" (1.746 m)  Wt 382 lb (173.274 kg)  BMI 56.84 kg/m2  LMP 12/23/2013 Height: 5' 8.75" (174.6 cm)  Ht Readings from Last 3 Encounters:  12/26/13 5' 8.75" (1.746 m)  08/16/13 5\' 9"  (1.753 m)  07/17/13 5\' 9"  (1.753 m)    General appearance: alert, cooperative and appears stated age Head: Normocephalic, without obvious abnormality, atraumatic Neck: no adenopathy, supple, symmetrical, trachea midline and thyroid normal to inspection and palpation Lungs: clear to auscultation bilaterally Breasts: normal appearance, no masses or tenderness Heart: regular rate and rhythm Abdomen: soft, non-tender; no masses,  no organomegaly Extremities: extremities normal, atraumatic, no cyanosis or edema Skin: Skin color, texture, turgor normal. No rashes or lesions Lymph nodes: Cervical, supraclavicular, and axillary nodes normal. No abnormal inguinal nodes palpated Neurologic: Grossly normal   Pelvic: External genitalia:  no lesions              Urethra:  normal appearing urethra with no masses, tenderness or lesions              Bartholin's and Skene's: normal                 Vagina: normal appearing vagina with normal color and moderate menses discharge, no lesions              Cervix: anteverted              Pap taken: no Bimanual Exam:  Uterus:  normal size, contour, position, consistency, mobility, non-tender, limited secondary to body habitus              Adnexa: no mass, fullness, tenderness                Rectovaginal: declines               Anus:  declines  A:  Well Woman with normal exam  Regular menses - not SA  P:   Reviewed health and wellness pertinent to exam  Pap smear not taken today  Mammogram due 2/16  Counseled on breast self exam, mammography screening, adequate intake of calcium and vitamin D, diet and exercise, Kegel's exercises return annually or prn  An After Visit Summary was printed and given to the patient.

## 2013-12-26 NOTE — Patient Instructions (Signed)

## 2013-12-28 NOTE — Progress Notes (Signed)
Encounter reviewed by Dr. Avis Tirone Silva.  

## 2014-06-15 ENCOUNTER — Other Ambulatory Visit: Payer: Self-pay

## 2014-07-02 ENCOUNTER — Encounter: Payer: Self-pay | Admitting: Nurse Practitioner

## 2014-07-30 ENCOUNTER — Ambulatory Visit
Admission: RE | Admit: 2014-07-30 | Discharge: 2014-07-30 | Disposition: A | Discharge status: Home / Self Care | Payer: 59 | Source: Ambulatory Visit | Attending: Family | Admitting: Family

## 2014-07-30 ENCOUNTER — Other Ambulatory Visit: Payer: Self-pay | Admitting: Family

## 2014-07-30 DIAGNOSIS — R05 Cough: Secondary | ICD-10-CM

## 2014-07-30 DIAGNOSIS — R509 Fever, unspecified: Secondary | ICD-10-CM

## 2014-07-30 DIAGNOSIS — R059 Cough, unspecified: Secondary | ICD-10-CM

## 2014-09-28 ENCOUNTER — Other Ambulatory Visit: Payer: Self-pay

## 2014-09-28 DIAGNOSIS — Z1231 Encounter for screening mammogram for malignant neoplasm of breast: Secondary | ICD-10-CM

## 2014-10-29 ENCOUNTER — Ambulatory Visit
Admission: RE | Admit: 2014-10-29 | Discharge: 2014-10-29 | Disposition: A | Discharge status: Home / Self Care | Payer: 59 | Source: Ambulatory Visit

## 2014-10-29 DIAGNOSIS — Z1231 Encounter for screening mammogram for malignant neoplasm of breast: Secondary | ICD-10-CM

## 2014-12-28 ENCOUNTER — Ambulatory Visit: Payer: 59 | Admitting: Nurse Practitioner

## 2015-02-28 ENCOUNTER — Ambulatory Visit (INDEPENDENT_AMBULATORY_CARE_PROVIDER_SITE_OTHER): Payer: Commercial Managed Care - HMO | Admitting: Nurse Practitioner

## 2015-02-28 ENCOUNTER — Encounter: Payer: Self-pay | Admitting: Nurse Practitioner

## 2015-02-28 VITALS — BP 120/82 | HR 92 | Resp 16 | Ht 68.25 in | Wt 374.0 lb

## 2015-02-28 DIAGNOSIS — Z01419 Encounter for gynecological examination (general) (routine) without abnormal findings: Secondary | ICD-10-CM

## 2015-02-28 DIAGNOSIS — Z0000 Encounter for general adult medical examination without abnormal findings: Secondary | ICD-10-CM | POA: Diagnosis not present

## 2015-02-28 NOTE — Progress Notes (Signed)
Encounter reviewed by Dr. Malak Orantes Amundson C. Silva.  

## 2015-02-28 NOTE — Progress Notes (Signed)
Patient ID: Debra French, female   DOB: October 31, 1970, 44 y.o.   MRN: [id number] 44 y.o. G1P1 Single  Caucasian Fe here for annual exam.  Menses now at 4 days.  Heavy for 3, cramps for 3 days. Son away at camp.  Bad case of poison ivy and had a steroid injection.  Patient's last menstrual period was 02/17/2015.          Sexually active: No.  The current method of family planning is none.    Exercising: No.   Smoker:  no  Health Maintenance: Pap:  12-14-11 WNL MMG:  10-30-14 WNL BI Rads 1 TDaP:  05/01/2010 Labs: PCP does labs    reports that she has never smoked. She has never used smokeless tobacco. She reports that she does not drink alcohol or use illicit drugs.  Past Medical History  Diagnosis Date  . Obesity 03/1998  . STD (sexually transmitted disease) 11/27/97    Hx of Chlamydia    Past Surgical History  Procedure Laterality Date  . Colposcopy  05/1998    neg ECC, neg Bx, CIN I    Current Outpatient Prescriptions  Medication Sig Dispense Refill  . Calcium-Magnesium-Vitamin D (CALCIUM 500 PO) Take by mouth.    . Cetirizine HCl 10 MG CAPS Take 10 mg by mouth as needed.     . Cholecalciferol (VITAMIN D-3) 5000 UNITS TABS Take 1 tablet by mouth daily.    . Citalopram Hydrobromide (CELEXA PO) Take 40 mg by mouth daily.     . famotidine (PEPCID) 20 MG tablet Take 20 mg by mouth daily.    . ferrous sulfate 325 (65 FE) MG EC tablet Take 325 mg by mouth daily with breakfast.    . LORazepam (ATIVAN) 1 MG tablet Take 1 mg by mouth every 8 (eight) hours. PRN    . Multiple Vitamin (MULTIVITAMIN) tablet Take 1 tablet by mouth daily.    . phenylephrine (SUDAFED PE) 10 MG TABS tablet Take 10 mg by mouth daily.     No current facility-administered medications for this visit.    Family History  Problem Relation Age of Onset  . Heart disease Father   . Hyperlipidemia Father   . Multiple births Maternal Grandmother   . Hypertension Maternal Grandmother   . Stroke Maternal  Grandmother   . Multiple births Paternal Grandmother   . Diabetes Paternal Grandmother   . Heart disease Paternal Grandfather   . Heart attack Paternal Grandfather   . Hyperlipidemia Sister   . Hyperlipidemia Mother     episode of A - fib  . Heart disease Maternal Uncle     A-Fib  . Heart disease Maternal Uncle   . Heart disease Maternal Uncle     ROS:  Pertinent items are noted in HPI.  Otherwise, a comprehensive ROS was negative.  Exam:   BP 120/82 mmHg  Pulse 92  Resp 16  Ht 5' 8.25" (1.734 m)  Wt 374 lb (169.645 kg)  BMI 56.42 kg/m2  LMP 02/17/2015 Height: 5' 8.25" (173.4 cm) Ht Readings from Last 3 Encounters:  02/28/15 5' 8.25" (1.734 m)  12/26/13 5' 8.75" (1.746 m)  08/16/13 5\' 9"  (1.753 m)    General appearance: alert, cooperative and appears stated age Head: Normocephalic, without obvious abnormality, atraumatic Neck: no adenopathy, supple, symmetrical, trachea midline and thyroid normal to inspection and palpation Lungs: clear to auscultation bilaterally Breasts: normal appearance, no masses or tenderness Heart: regular rate and rhythm Abdomen: soft, non-tender; no masses,  no organomegaly Extremities: extremities normal, atraumatic, no cyanosis or edema Skin: Skin color, texture, turgor normal. No rashes or lesions Lymph nodes: Cervical, supraclavicular, and axillary nodes normal. No abnormal inguinal nodes palpated Neurologic: Grossly normal   Pelvic: External genitalia:  no lesions              Urethra:  normal appearing urethra with no masses, tenderness or lesions              Bartholin's and Skene's: normal                 Vagina: normal appearing vagina with normal color and discharge, no lesions              Cervix: anteverted              Pap taken: Yes.   Bimanual Exam:  Uterus:  normal size, contour, position, consistency, mobility, non-tender              Adnexa: no mass, fullness, tenderness               Rectovaginal: Confirms                Anus:  normal sphincter tone, no lesions  Chaperone present: Yes  A:  Well Woman with normal exam  Regular menses - not SA   P:   Reviewed health and wellness pertinent to exam  Pap smear as above  Mammogram is due 3/17  Counseled on breast self exam, mammography screening, adequate intake of calcium and vitamin D, diet and exercise, Kegel's exercises return annually or prn  An After Visit Summary was printed and given to the patient.

## 2015-02-28 NOTE — Patient Instructions (Signed)

## 2015-03-05 LAB — IPS PAP TEST WITH HPV

## 2015-03-11 ENCOUNTER — Encounter: Payer: Self-pay | Admitting: Obstetrics and Gynecology

## 2015-03-11 ENCOUNTER — Ambulatory Visit (INDEPENDENT_AMBULATORY_CARE_PROVIDER_SITE_OTHER): Payer: Commercial Managed Care - HMO | Admitting: Obstetrics and Gynecology

## 2015-03-11 VITALS — BP 126/80 | HR 76 | Ht 68.25 in | Wt 378.4 lb

## 2015-03-11 DIAGNOSIS — N9089 Other specified noninflammatory disorders of vulva and perineum: Secondary | ICD-10-CM | POA: Diagnosis not present

## 2015-03-11 DIAGNOSIS — R7309 Other abnormal glucose: Secondary | ICD-10-CM | POA: Diagnosis not present

## 2015-03-11 DIAGNOSIS — Z708 Other sex counseling: Secondary | ICD-10-CM | POA: Diagnosis not present

## 2015-03-11 NOTE — Progress Notes (Signed)
GYNECOLOGY  VISIT   HPI: 44 y.o.   Single  Caucasian  female   G1P1 with Patient's last menstrual period was 02/17/2015.   here for consult.  Patient has genital warts and wants to discuss treatment options.  Also has skin tags.  No treatment to date.  Paps are normal. Pap 02/28/15 normal and no HR HPV.   Some menses are heavy and crampy.   Not a smoker.   Not taking steroids.   I delivered her son 11 years ago.  Hgb A1C has been elevated. It was 6.1 last fall.   Not sexually active for 10 years.  Last partner had high risk behavior.   GYNECOLOGIC HISTORY: Patient's last menstrual period was 02/17/2015. Contraception: None Menopausal hormone therapy: None Last mammogram: 10/30/14 breast density category a; bi-rads 1: neg Last pap smear: 02/28/15 wnl neg hr hpv        OB History    Gravida Para Term Preterm AB TAB SAB Ectopic Multiple Living   Patient Active Problem List   Diagnosis Date Noted  . Achilles tendinitis of right lower extremity 07/18/2013    Past Medical History  Diagnosis Date  . Obesity 03/1998  . STD (sexually transmitted disease) 11/27/97    Hx of Chlamydia  . Anxiety   . Reflux     Past Surgical History  Procedure Laterality Date  . Colposcopy  05/1998    neg ECC, neg Bx, CIN I    Current Outpatient Prescriptions  Medication Sig Dispense Refill  . Calcium-Magnesium-Vitamin D (CALCIUM 500 PO) Take by mouth.    . Cetirizine HCl 10 MG CAPS Take 10 mg by mouth as needed.     . Cholecalciferol (VITAMIN D-3) 5000 UNITS TABS Take 1 tablet by mouth daily.    . Citalopram Hydrobromide (CELEXA PO) Take 40 mg by mouth daily.     . famotidine (PEPCID) 20 MG tablet Take 20 mg by mouth daily.    . ferrous sulfate 325 (65 FE) MG EC tablet Take 325 mg by mouth daily with breakfast.    . LORazepam (ATIVAN) 1 MG tablet Take 1 mg by mouth every 8 (eight) hours. PRN    . Multiple Vitamin (MULTIVITAMIN) tablet Take 1 tablet by mouth  daily.    . phenylephrine (SUDAFED PE) 10 MG TABS tablet Take 10 mg by mouth daily.     No current facility-administered medications for this visit.     ALLERGIES: Review of patient's allergies indicates no known allergies.  Family History  Problem Relation Age of Onset  . Heart disease Father   . Hyperlipidemia Father   . Multiple births Maternal Grandmother   . Hypertension Maternal Grandmother   . Stroke Maternal Grandmother   . Multiple births Paternal Grandmother   . Diabetes Paternal Grandmother   . Heart disease Paternal Grandfather   . Heart attack Paternal Grandfather   . Hyperlipidemia Sister   . Hyperlipidemia Mother     episode of A - fib  . Heart disease Maternal Uncle     A-Fib  . Heart disease Maternal Uncle   . Heart disease Maternal Uncle     History   Social History  . Marital Status: Single    Spouse Name: N/A  . Number of Children: N/A  . Years of Education: N/A   Occupational History  . Not on file.   Social History Main  Topics  . Smoking status: Never Smoker   . Smokeless tobacco: Never Used  . Alcohol Use: No  . Drug Use: No  . Sexual Activity: No   Other Topics Concern  . Not on file   Social History Narrative    ROS:  Pertinent items are noted in HPI.  PHYSICAL EXAMINATION:    BP 126/80 mmHg  Pulse 76  Ht 5' 8.25" (1.734 m)  Wt 378 lb 6.4 oz (171.641 kg)  BMI 57.09 kg/m2  LMP 02/17/2015    General appearance: alert, cooperative and appears stated age   Skin: Brown thickened polypoid lesions of the bilateral medial thighs and vulva.      Pelvic: External genitalia:  Manson PasseyBrown thickened polypoid lesions of the bilateral medial thighs and vulva.               Urethra:  normal appearing urethra with no masses, tenderness or lesions              Bartholins and Skenes: normal                 Vagina: normal appearing vagina with normal color and discharge, no lesions              Cervix: no lesions and  Cervix barley seen.       Bimanual Exam:  Uterus:  normal size, contour, position, consistency, mobility, non-tender and exam limited by body habitus.               Adnexa: normal adnexa, no mass, fullness, tenderness and  exam limited by baody habitus.               Chaperone was present for exam.  ASSESSMENT  Elevated hemoglobin A1C. Vulvar lesions.  Condyloma? Need for HIV testing.   PLAN  Counseled regarding condyloma and treatment options.  I would recommend multiple biopsies to confirm the diagnosis.  Interferon candidate? Check hemoglobin A1C.  HIV, Hep C, Hep B, and RPR.    An After Visit Summary was printed and given to the patient.  ___15__ minutes face to face time of which over 50% was spent in counseling.

## 2015-03-12 LAB — HEPATITIS C ANTIBODY: HCV Ab: NEGATIVE

## 2015-03-12 LAB — STD PANEL
HIV: NONREACTIVE
Hepatitis B Surface Ag: NEGATIVE

## 2015-03-12 LAB — HEMOGLOBIN A1C
HEMOGLOBIN A1C: 6.2 % — AB (ref <5.7)
Mean Plasma Glucose: 131 mg/dL — ABNORMAL HIGH (ref <117)

## 2015-03-29 ENCOUNTER — Ambulatory Visit (INDEPENDENT_AMBULATORY_CARE_PROVIDER_SITE_OTHER): Payer: Commercial Managed Care - HMO | Admitting: Obstetrics and Gynecology

## 2015-03-29 ENCOUNTER — Encounter: Payer: Self-pay | Admitting: Obstetrics and Gynecology

## 2015-03-29 DIAGNOSIS — N9089 Other specified noninflammatory disorders of vulva and perineum: Secondary | ICD-10-CM | POA: Diagnosis not present

## 2015-03-29 NOTE — Patient Instructions (Signed)
.  VULVAR BIOPSY POST-PROCEDURE INSTRUCTIONS  1. You may take Ibuprofen, Aleve or Tylenol for pain if needed.    2. You may have a small amount of spotting.  You should wear a mini pad for the next few days.  3. You may use some topical Neosporin ointment if you would like (over the counter is fine).  4. You need to call if you have redness around the biopsy site, if there is any unusual draining, if the bleeding is heavy, or if you are concerned.  5. Shower or bathe as normal  We will call you within one week with results or we will discuss the results at your follow-up appointment if needed.

## 2015-03-29 NOTE — Progress Notes (Signed)
Subjective:     Patient ID: Debra French, female   DOB: [birth year], 44 y.o.   MRN: [id number]  HPI  Patient here for vulvar biopsies today. Has verrucous changes of skin of bilateral labia and medial thighs for about the last year.  Patient states, "I want to get rid of these!"  Review of Systems  LMP: 03-18-15 Contraception:  None/abstinence     Objective:   Physical Exam  Genitourinary:     Vulvar biopsies. Verrucous hypertrophied polypoid changes of skin of medical thighs and labia majora bilaterally. Consent for procedure.  Sterile prep of skin with betadine.  1% lidocaine - lot [id number], expiration 09/01/15. Scissors used to biopsy skin of left thigh, right vulva, and right thigh.  Tissue to pathology all separately.  AgNO3 to biopsy sites.  One single suture of 3/0 vicryl to right medial thigh.  Good hemostasis.  Bandaids placed on each. Minimal EBL.  No complications.      Assessment:     Vulvar and medial thigh skin lesions.     Plan:     Instructions and precautions given.  Follow up biopsy results.  Plan to follow depending on results.   After visit summary to patient.

## 2015-04-02 LAB — IPS OTHER TISSUE BIOPSY

## 2015-04-04 ENCOUNTER — Telehealth: Payer: Self-pay

## 2015-04-04 NOTE — Telephone Encounter (Signed)
Spoke with patient. Advised of results as seen below from Dr.Silva. Patient is agreeable and verbalizes understanding. Patient is scheduled to see her dermatologist in 2 months and will have this looked at at that appointment. Will call if she needs anything or has any further questions.  Routing to provider for final review. Patient agreeable to disposition. Will close encounter.   Patient aware provider will review message and nurse will return call if any additional advice or change of disposition.

## 2015-04-04 NOTE — Telephone Encounter (Signed)
-----   Message from Patton Salles, MD sent at 04/03/2015  9:11 PM EDT ----- Please report benign results of the thigh and vulvar biopsies to patient.  These are all skin tags and are not condyloma, precancer, or cancerous change.  I would recommend referral to dermatology, if patient desires,  to see if they recommend any treatment at this time.  This would be for patient comfort or cosmetic reasons as they are not indicative of any concerning process.   Cc - Patty Malachy Mood

## 2015-08-30 ENCOUNTER — Ambulatory Visit (INDEPENDENT_AMBULATORY_CARE_PROVIDER_SITE_OTHER): Payer: Commercial Managed Care - HMO | Admitting: Family Medicine

## 2015-08-30 ENCOUNTER — Encounter: Payer: Self-pay | Admitting: Family Medicine

## 2015-08-30 VITALS — BP 117/82 | HR 98 | Ht 69.0 in | Wt 365.0 lb

## 2015-08-30 DIAGNOSIS — M25571 Pain in right ankle and joints of right foot: Secondary | ICD-10-CM | POA: Diagnosis not present

## 2015-08-30 NOTE — Patient Instructions (Signed)
You have posterior tibalis tendinitis. Take ibuprofen 600mg  three times a day with food OR aleve 2 tabs twice a day with food for pain and inflammation for 7-10 days then as needed. Ice area 15 minutes at a time 3-4 times a day. Wear the boot with arch support OR good shoes with arch support (dr. Jari Sportsmanscholls or the green sports insoles). Do home exercises 3 sets of 10 once a day with theraband. Follow up with me in 6 weeks for reevaluation. Call me sooner if you want to try physical therapy or the nitro patches.

## 2015-09-03 DIAGNOSIS — M25571 Pain in right ankle and joints of right foot: Secondary | ICD-10-CM | POA: Insufficient documentation

## 2015-09-03 NOTE — Assessment & Plan Note (Signed)
consistent with posterior tibialis tendinitis.  Brief MSK u/s shows tendon is intact though has target sign at level of ankle joint.  Icing, nsaids.  Shown home exercises to do daily.  Going to wear cam walker initially to rest this with arch support.  F/u in 6 weeks.  Consider physical therapy, nitro patches if not improving.

## 2015-09-03 NOTE — Progress Notes (Signed)
PCP: Boneta LucksJennifer Brown, NP  Subjective:   HPI: Patient is a 45 y.o. female here for right ankle pain.  Patient denies known injury or trauma. States she started to get medial right ankle pain about 10 days ago. Some associated swelling. Pain up to 6/10 level, sharp. Worse with walking. No skin changes, fever, other complaints.  Past Medical History  Diagnosis Date  . Obesity 03/1998  . STD (sexually transmitted disease) 11/27/97    Hx of Chlamydia  . Anxiety   . Reflux   . Genital warts     Current Outpatient Prescriptions on File Prior to Visit  Medication Sig Dispense Refill  . Calcium-Magnesium-Vitamin D (CALCIUM 500 PO) Take by mouth.    . Cetirizine HCl 10 MG CAPS Take 10 mg by mouth as needed.     . Cholecalciferol (VITAMIN D-3) 5000 UNITS TABS Take 1 tablet by mouth daily.    . citalopram (CELEXA) 40 MG tablet Take 1 tablet by mouth daily.    . famotidine (PEPCID) 20 MG tablet Take 20 mg by mouth daily.    . ferrous sulfate 325 (65 FE) MG EC tablet Take 325 mg by mouth daily with breakfast.    . LORazepam (ATIVAN) 1 MG tablet Take 1 mg by mouth every 8 (eight) hours. PRN    . Multiple Vitamin (MULTIVITAMIN) tablet Take 1 tablet by mouth daily.    . phenylephrine (SUDAFED PE) 10 MG TABS tablet Take 10 mg by mouth daily.     No current facility-administered medications on file prior to visit.    Past Surgical History  Procedure Laterality Date  . Colposcopy  05/1998    neg ECC, neg Bx, CIN I    No Known Allergies  Social History   Social History  . Marital Status: Single    Spouse Name: N/A  . Number of Children: N/A  . Years of Education: N/A   Occupational History  . Not on file.   Social History Main Topics  . Smoking status: Never Smoker   . Smokeless tobacco: Never Used  . Alcohol Use: No  . Drug Use: No  . Sexual Activity: No   Other Topics Concern  . Not on file   Social History Narrative    Family History  Problem Relation Age of Onset   . Heart disease Father   . Hyperlipidemia Father   . Multiple births Maternal Grandmother   . Hypertension Maternal Grandmother   . Stroke Maternal Grandmother   . Multiple births Paternal Grandmother   . Diabetes Paternal Grandmother   . Heart disease Paternal Grandfather   . Heart attack Paternal Grandfather   . Hyperlipidemia Sister   . Hyperlipidemia Mother     episode of A - fib  . Heart disease Maternal Uncle     A-Fib  . Heart disease Maternal Uncle   . Heart disease Maternal Uncle     BP 117/82 mmHg  Pulse 98  Ht 5\' 9"  (1.753 m)  Wt 365 lb (165.563 kg)  BMI 53.88 kg/m2  Review of Systems: See HPI above.    Objective:  Physical Exam:  Gen: NAD  Right ankle: No gross deformity, swelling, ecchymoses FROM with pain on full IR and ER. TTP over post tibialis tendon.  No other tenderness.  5/5 strength all motions. Negative ant drawer and talar tilt.   Negative syndesmotic compression. Thompsons test negative. NV intact distally.    Left ankle: FROM without pain.  Assessment & Plan:  1.  Right ankle pain - consistent with posterior tibialis tendinitis.  Brief MSK u/s shows tendon is intact though has target sign at level of ankle joint.  Icing, nsaids.  Shown home exercises to do daily.  Going to wear cam walker initially to rest this with arch support.  F/u in 6 weeks.  Consider physical therapy, nitro patches if not improving.

## 2015-09-20 ENCOUNTER — Other Ambulatory Visit: Payer: Self-pay

## 2015-09-20 DIAGNOSIS — Z1231 Encounter for screening mammogram for malignant neoplasm of breast: Secondary | ICD-10-CM

## 2015-10-11 ENCOUNTER — Ambulatory Visit: Payer: Commercial Managed Care - HMO | Admitting: Family Medicine

## 2015-10-31 ENCOUNTER — Ambulatory Visit
Admission: RE | Admit: 2015-10-31 | Discharge: 2015-10-31 | Disposition: A | Discharge status: Home / Self Care | Payer: Commercial Managed Care - HMO | Source: Ambulatory Visit

## 2015-10-31 ENCOUNTER — Ambulatory Visit: Payer: Commercial Managed Care - HMO

## 2015-10-31 DIAGNOSIS — Z1231 Encounter for screening mammogram for malignant neoplasm of breast: Secondary | ICD-10-CM

## 2016-03-04 ENCOUNTER — Encounter: Payer: Self-pay | Admitting: Nurse Practitioner

## 2016-03-04 ENCOUNTER — Ambulatory Visit (INDEPENDENT_AMBULATORY_CARE_PROVIDER_SITE_OTHER): Payer: Commercial Managed Care - HMO | Admitting: Nurse Practitioner

## 2016-03-04 VITALS — BP 130/76 | HR 96 | Resp 18 | Ht 68.5 in | Wt 382.0 lb

## 2016-03-04 DIAGNOSIS — R7309 Other abnormal glucose: Secondary | ICD-10-CM

## 2016-03-04 DIAGNOSIS — Z0000 Encounter for general adult medical examination without abnormal findings: Secondary | ICD-10-CM

## 2016-03-04 DIAGNOSIS — E559 Vitamin D deficiency, unspecified: Secondary | ICD-10-CM

## 2016-03-04 DIAGNOSIS — Z01419 Encounter for gynecological examination (general) (routine) without abnormal findings: Secondary | ICD-10-CM | POA: Diagnosis not present

## 2016-03-04 DIAGNOSIS — R7302 Impaired glucose tolerance (oral): Secondary | ICD-10-CM | POA: Diagnosis not present

## 2016-03-04 LAB — HEMOGLOBIN A1C
Hgb A1c MFr Bld: 6.5 % — ABNORMAL HIGH (ref <5.7)
MEAN PLASMA GLUCOSE: 140 mg/dL

## 2016-03-04 NOTE — Progress Notes (Signed)
45 y.o. G1P1 Single  Caucasian Fe here for annual exam.  Menses this past year a little more irregular at 28 - 11 weeks.  Now more regular this year.  Some cramps, some warm in general, some brain fog.  She has been on Bitter melon  Patient's last menstrual period was 03/01/2016.          Sexually active: No.  The current method of family planning is abstinence.    Exercising: No.  The patient does not participate in regular exercise at present. Smoker:  no  Health Maintenance: Pap:  02/28/15 Neg. HR HPV:neg MMG:  11/04/15 BIRADS1:neg TDaP:  05/01/10 HIV 03/11/15 Neg Labs: PCP  Last HGB AIC was at 6.2   reports that she has never smoked. She has never used smokeless tobacco. She reports that she does not drink alcohol or use illicit drugs.  Past Medical History  Diagnosis Date  . Obesity 03/1998  . STD (sexually transmitted disease) 11/27/97    Hx of Chlamydia  . Anxiety   . Reflux   . Genital warts     Past Surgical History  Procedure Laterality Date  . Colposcopy  05/1998    neg ECC, neg Bx, CIN I    Current Outpatient Prescriptions  Medication Sig Dispense Refill  . Calcium-Magnesium-Vitamin D (CALCIUM 500 PO) Take by mouth.    . Cetirizine HCl 10 MG CAPS Take 10 mg by mouth as needed.     . Cholecalciferol (VITAMIN D-3) 5000 UNITS TABS Take 1 tablet by mouth daily.    . citalopram (CELEXA) 40 MG tablet Take 1 tablet by mouth daily.    . famotidine (PEPCID) 20 MG tablet Take 20 mg by mouth daily.    . ferrous sulfate 325 (65 FE) MG EC tablet Take 325 mg by mouth daily with breakfast. Reported on 03/04/2016    . LORazepam (ATIVAN) 1 MG tablet Take 1 mg by mouth every 8 (eight) hours. PRN    . Multiple Vitamin (MULTIVITAMIN) tablet Take 1 tablet by mouth daily.    . phenylephrine (SUDAFED PE) 10 MG TABS tablet Take 10 mg by mouth daily.     No current facility-administered medications for this visit.    Family History  Problem Relation Age of Onset  . Heart disease Father    . Hyperlipidemia Father   . Multiple births Maternal Grandmother   . Hypertension Maternal Grandmother   . Stroke Maternal Grandmother   . Multiple births Paternal Grandmother   . Diabetes Paternal Grandmother   . Heart disease Paternal Grandfather   . Heart attack Paternal Grandfather   . Hyperlipidemia Sister   . Hyperlipidemia Mother     episode of A - fib  . Heart disease Maternal Uncle     A-Fib  . Heart disease Maternal Uncle   . Heart disease Maternal Uncle     ROS:  Pertinent items are noted in HPI.  Otherwise, a comprehensive ROS was negative.  Exam:   BP 130/76 mmHg  Pulse 96  Resp 18  Ht 5' 8.5" (1.74 m)  Wt 382 lb (173.274 kg)  BMI 57.23 kg/m2  LMP 03/01/2016 Height: 5' 8.5" (174 cm) Ht Readings from Last 3 Encounters:  03/04/16 5' 8.5" (1.74 m)  08/30/15 5\' 9"  (1.753 m)  03/29/15 5' 8.25" (1.734 m)    General appearance: alert, cooperative and appears stated age Head: Normocephalic, without obvious abnormality, atraumatic Neck: no adenopathy, supple, symmetrical, trachea midline and thyroid normal to inspection and palpation  Lungs: clear to auscultation bilaterally Breasts: normal appearance, no masses or tenderness Heart: regular rate and rhythm Abdomen: soft, non-tender; no masses,  no organomegaly Extremities: extremities normal, atraumatic, no cyanosis or edema Skin: Skin color, texture, turgor normal. No rashes or lesions Lymph nodes: Cervical, supraclavicular, and axillary nodes normal. No abnormal inguinal nodes palpated Neurologic: Grossly normal   Pelvic: External genitalia:  no lesions              Urethra:  normal appearing urethra with no masses, tenderness or lesions              Bartholin's and Skene's: normal                 Vagina: normal appearing vagina with normal color and discharge, no lesions              Cervix: anteverted              Pap taken: No. Bimanual Exam:  Uterus:  normal size, contour, position, consistency,  mobility, non-tender              Adnexa: no mass, fullness, tenderness               Rectovaginal: Confirms               Anus:  normal sphincter tone, no lesions  Chaperone present: yes  A:  Well Woman with normal exam  Slight irregular menses this past fall  Elevated HGB AIC  History of Vit d deficiency  Not SA  P:   Reviewed health and wellness pertinent to exam  Pap smear as above  Mammogram is due 10/2016  Continue to monitor menses  Follow with labs  Counseled on breast self exam, mammography screening, adequate intake of calcium and vitamin D, diet and exercise return annually or prn  An After Visit Summary was printed and given to the patient.

## 2016-03-04 NOTE — Patient Instructions (Signed)

## 2016-03-05 LAB — VITAMIN D 25 HYDROXY (VIT D DEFICIENCY, FRACTURES): VIT D 25 HYDROXY: 46 ng/mL (ref 30–100)

## 2016-03-08 ENCOUNTER — Encounter: Payer: Self-pay | Admitting: Nurse Practitioner

## 2016-03-08 DIAGNOSIS — R7309 Other abnormal glucose: Secondary | ICD-10-CM | POA: Insufficient documentation

## 2016-03-08 NOTE — Progress Notes (Signed)
Encounter reviewed by Dr. Jackeline Gutknecht Amundson C. Silva.  

## 2016-10-05 ENCOUNTER — Other Ambulatory Visit: Payer: Self-pay | Admitting: Nurse Practitioner

## 2016-10-05 DIAGNOSIS — Z1231 Encounter for screening mammogram for malignant neoplasm of breast: Secondary | ICD-10-CM

## 2016-11-02 ENCOUNTER — Ambulatory Visit
Admission: RE | Admit: 2016-11-02 | Discharge: 2016-11-02 | Disposition: A | Discharge status: Home / Self Care | Payer: Commercial Managed Care - HMO | Source: Ambulatory Visit | Attending: Nurse Practitioner | Admitting: Nurse Practitioner

## 2016-11-02 DIAGNOSIS — Z1231 Encounter for screening mammogram for malignant neoplasm of breast: Secondary | ICD-10-CM

## 2017-02-26 ENCOUNTER — Telehealth: Payer: Self-pay | Admitting: Nurse Practitioner

## 2017-02-26 NOTE — Telephone Encounter (Signed)
Left message to reschedule cancelled appointment.

## 2017-03-08 ENCOUNTER — Ambulatory Visit: Payer: Commercial Managed Care - HMO | Admitting: Nurse Practitioner

## 2017-03-10 ENCOUNTER — Encounter: Payer: Self-pay | Admitting: Obstetrics and Gynecology

## 2017-03-10 ENCOUNTER — Ambulatory Visit (INDEPENDENT_AMBULATORY_CARE_PROVIDER_SITE_OTHER): Payer: 59 | Admitting: Obstetrics and Gynecology

## 2017-03-10 VITALS — BP 112/70 | HR 96 | Resp 16 | Ht 68.75 in | Wt 382.0 lb

## 2017-03-10 DIAGNOSIS — N926 Irregular menstruation, unspecified: Secondary | ICD-10-CM | POA: Diagnosis not present

## 2017-03-10 DIAGNOSIS — Z01419 Encounter for gynecological examination (general) (routine) without abnormal findings: Secondary | ICD-10-CM | POA: Diagnosis not present

## 2017-03-10 DIAGNOSIS — R7309 Other abnormal glucose: Secondary | ICD-10-CM | POA: Diagnosis not present

## 2017-03-10 NOTE — Progress Notes (Signed)
Opened in error

## 2017-03-10 NOTE — Patient Instructions (Signed)

## 2017-03-10 NOTE — Progress Notes (Signed)
46 y.o. G77P1 Single Caucasian female here for annual exam.    Having abnormal menstrual bleeding.  No menses for 9 weeks and then spots for a day.  Menses not regular for about 2 years.  Menses occur every 3.5 - 11 weeks.  Bleeds for about 4 days.  Flow is moderate.  Some cramping again.  Some night sweats.   Hgb A1C 6.5 last year.  Not followed currently by PCP.  Needs a new provider.  I cared for patient when she was pregnant 14 years ago.   PCP: No PCP    Patient's last menstrual period was 02/22/2017.           Sexually active: No.  The current method of family planning is none.    Exercising: No.  The patient does not participate in regular exercise at present. Smoker:  no  Health Maintenance: Pap:  02/28/15 Neg. HR HPV:neg History of abnormal Pap:  Yes, years ago MMG:  11/02/16 BIRADS 1 negative/density a TDaP:  2011 HIV and Hep C: 03/11/15 -- negative Screening Labs:  Discuss today   reports that she has never smoked. She has never used smokeless tobacco. She reports that she does not drink alcohol or use drugs.  Past Medical History:  Diagnosis Date  . Anxiety   . Elevated hemoglobin A1c   . Genital warts   . Obesity 03/1998  . Reflux   . STD (sexually transmitted disease) 11/27/97   Hx of Chlamydia    Past Surgical History:  Procedure Laterality Date  . COLPOSCOPY  05/1998   neg ECC, neg Bx, CIN I    Current Outpatient Prescriptions  Medication Sig Dispense Refill  . Calcium-Magnesium-Vitamin D (CALCIUM 500 PO) Take by mouth.    . Cetirizine HCl 10 MG CAPS Take 10 mg by mouth as needed.     . Cholecalciferol (VITAMIN D-3) 5000 UNITS TABS Take 1 tablet by mouth daily.    . citalopram (CELEXA) 40 MG tablet Take 1 tablet by mouth daily.    . famotidine (PEPCID) 20 MG tablet Take 20 mg by mouth daily.    . ferrous sulfate 325 (65 FE) MG EC tablet Take 325 mg by mouth daily with breakfast. Reported on 03/04/2016    . LORazepam (ATIVAN) 1 MG tablet Take 1 mg  by mouth every 8 (eight) hours. PRN    . Multiple Vitamin (MULTIVITAMIN) tablet Take 1 tablet by mouth daily.    . phenylephrine (SUDAFED PE) 10 MG TABS tablet Take 10 mg by mouth daily.     No current facility-administered medications for this visit.     Family History  Problem Relation Age of Onset  . Heart disease Father   . Hyperlipidemia Father   . Multiple births Maternal Grandmother   . Hypertension Maternal Grandmother   . Stroke Maternal Grandmother   . Multiple births Paternal Grandmother   . Diabetes Paternal Grandmother   . Heart disease Paternal Grandfather   . Heart attack Paternal Grandfather   . Hyperlipidemia Sister   . Hyperlipidemia Mother        episode of A - fib  . Heart disease Maternal Uncle        A-Fib  . Heart disease Maternal Uncle   . Heart disease Maternal Uncle     ROS:  Pertinent items are noted in HPI.  Otherwise, a comprehensive ROS was negative.  Exam:   BP 112/70 (BP Location: Right Arm, Patient Position: Sitting, Cuff Size: Large)  Pulse 96   Resp 16   Ht 5' 8.75" (1.746 m)   Wt (!) 382 lb (173.3 kg)   LMP 02/22/2017   BMI 56.82 kg/m     General appearance: alert, cooperative and appears stated age Head: Normocephalic, without obvious abnormality, atraumatic Neck: no adenopathy, supple, symmetrical, trachea midline and thyroid normal to inspection and palpation Lungs: clear to auscultation bilaterally Breasts: normal appearance, no masses or tenderness, No nipple retraction or dimpling, No nipple discharge or bleeding, No axillary or supraclavicular adenopathy Heart: regular rate and rhythm Abdomen: obese, abdomen with indurated pannus, non-tender; no masses, no organomegaly Extremities: extremities normal, atraumatic, no cyanosis or edema Skin: Skin color, texture, turgor normal. No rashes or lesions Lymph nodes: Cervical, supraclavicular, and axillary nodes normal. No abnormal inguinal nodes palpated Neurologic: Grossly  normal  Pelvic: External genitalia:  no lesions              Urethra:  normal appearing urethra with no masses, tenderness or lesions              Bartholins and Skenes: normal                 Vagina: normal appearing vagina with normal color and discharge, no lesions              Cervix: no lesions              Pap taken: No. Bimanual Exam:  Uterus:  normal size, contour, position, consistency, mobility, non-tender.  Exam [facility] by [facility].              Adnexa: no mass, fullness, tenderness              Rectal exam: Yes.  .  Confirms.              Anus:  normal sphincter tone, no lesions  Chaperone was present for exam.  Assessment:   Well woman visit with normal exam. Oligomenorrhea.  Obesity. Hx elevated hemoglobin A1C.  Hx abnormal pap in remote past.   Plan: Mammogram screening discussed. Recommended self breast awareness. Pap and HR HPV as above. Guidelines for Calcium, Vitamin D, regular exercise program including cardiovascular and weight bearing exercise. Check TSH, prolactin, FSH, LH, estradiol at her return visit.  EPIC stopped working at the end of the visit so labs could not be drawn.  Return for pelvic ultrasound and potential EMB.  Rational discussed including diagnosing hyperplasia and potential malignancy.  [facility] with new PCP. Follow up annually and prn.    After visit summary provided.

## 2017-03-15 ENCOUNTER — Telehealth: Payer: Self-pay | Admitting: Obstetrics and Gynecology

## 2017-03-15 NOTE — Telephone Encounter (Signed)
Thank you for the update. You may close the encounter. 

## 2017-03-15 NOTE — Telephone Encounter (Signed)
Spoke with patient regarding benefit for an ultrasound and endometrial biopsy. Patient understood and agreeable. Patient ready to schedule. Patient scheduled 04/01/17 with Dr Edward JollySilva. Patient aware of date, arrival time and cancellation policy. No further questions.   Routing to Dr Edward JollySilva for final review

## 2017-03-25 ENCOUNTER — Telehealth: Payer: Self-pay | Admitting: Obstetrics and Gynecology

## 2017-03-25 NOTE — Telephone Encounter (Signed)
I agree with your recommendations.  Encounter closed. 

## 2017-03-25 NOTE — Telephone Encounter (Signed)
Patient is scheduled for PUS and EMB with Dr.Silva on 04/01/17. States she has not had her menses since 02/22/17. Reports her menses comes every 3 1/12 to 11 weeks. Patient is not sexually active. Denies chance for pregnancy. Advised to keep appointment as scheduled. If she started her menses will need to notify the office. Advised if Dr.Silva would like appointment adjusted she will be contacted. Patient is agreeable.  Routing to provider for final review. Patient agreeable to disposition. Will close encounter.

## 2017-03-25 NOTE — Telephone Encounter (Signed)
Patient called and with questions about her upcoming ultrasound on 04/01/17 and her menstrual cycle. She said she has not had a cycle since June but was supposed to have the ultrasound 7-11 days after her last cycle.

## 2017-03-30 ENCOUNTER — Telehealth: Payer: Self-pay | Admitting: *Deleted

## 2017-03-30 NOTE — Telephone Encounter (Signed)
Spoke with patient. Patient states she is scheduled for PUS and possible EMB on 8/2 with Dr. Edward JollySilva and menses has started. Reports cycles usually last 4-5 days and can vary from light to heavy. Patient asking if she needs to change appointment.  Advised patient to keep 8/2 PUS appointment, EMB may need to be rescheduled if bleeding is heavy. Advised patient will review with Dr. Edward JollySilva on 8/1 and return call with any additional recommendations, patient is agreeable.    Dr. Edward JollySilva, any additional recommendations?

## 2017-03-31 NOTE — Telephone Encounter (Signed)
Spoke with patient, advised as seen below per Dr. Silva. Debra Jollynt states bleeding on lasted for 20 minutes then stopped, no bleeding since. Advised patient to keep appointment as scheduled, will update Dr. Edward JollySilva. Return call to office if bleeding restarts. Patient verbalizes understanding and is agreeable.  Routing to provider for final review. Patient is agreeable to disposition. Will close encounter.

## 2017-03-31 NOTE — Telephone Encounter (Signed)
I think it would be better to see the patient next Thursday instead for the pelvic ultrasound and endometrial biopsy.

## 2017-04-01 ENCOUNTER — Encounter: Payer: Self-pay | Admitting: Obstetrics and Gynecology

## 2017-04-01 ENCOUNTER — Ambulatory Visit (INDEPENDENT_AMBULATORY_CARE_PROVIDER_SITE_OTHER): Payer: 59

## 2017-04-01 ENCOUNTER — Ambulatory Visit (INDEPENDENT_AMBULATORY_CARE_PROVIDER_SITE_OTHER): Payer: 59 | Admitting: Obstetrics and Gynecology

## 2017-04-01 VITALS — BP 116/80 | HR 68 | Resp 16 | Wt 382.0 lb

## 2017-04-01 DIAGNOSIS — N915 Oligomenorrhea, unspecified: Secondary | ICD-10-CM

## 2017-04-01 DIAGNOSIS — N926 Irregular menstruation, unspecified: Secondary | ICD-10-CM

## 2017-04-01 NOTE — Progress Notes (Signed)
GYNECOLOGY  VISIT   HPI: 46 y.o.   Single  Caucasian  female   G1P1 with Patient's last menstrual period was 02/22/2017.   here for ultrasound/EMB/blood work for oligomenorrhea.  Menses every 3.5 - 11 weeks. Started having bleeding on 03/02/17.  Stopped and then restarted this am.  Last intercourse was beginning of September 2017.   Saw Dr. Lincoln Maxinisovik for blood glucose control.  No meds currently.   GYNECOLOGIC HISTORY: Patient's last menstrual period was 02/22/2017. Contraception:  Abstinence Menopausal hormone therapy:  n/a Last mammogram:  11/02/16 BIRADS 1 negative/density a Last pap smear:   02/28/15 Neg. HR HPV:neg        OB History    Gravida Para Term Preterm AB Living   1 1       1    SAB TAB Ectopic Multiple Live Births                     Patient Active Problem List   Diagnosis Date Noted  . Elevated hemoglobin A1c 03/08/2016  . Right ankle pain 09/03/2015  . Achilles tendinitis of right lower extremity 07/18/2013    Past Medical History:  Diagnosis Date  . Anxiety   . Elevated hemoglobin A1c   . Genital warts   . Obesity 03/1998  . Reflux   . STD (sexually transmitted disease) 11/27/97   Hx of Chlamydia    Past Surgical History:  Procedure Laterality Date  . COLPOSCOPY  05/1998   neg ECC, neg Bx, CIN I    Current Outpatient Prescriptions  Medication Sig Dispense Refill  . Calcium-Magnesium-Vitamin D (CALCIUM 500 PO) Take by mouth.    . Cetirizine HCl 10 MG CAPS Take 10 mg by mouth as needed.     . Cholecalciferol (VITAMIN D-3) 5000 UNITS TABS Take 1 tablet by mouth daily.    . citalopram (CELEXA) 40 MG tablet Take 1 tablet by mouth daily.    . famotidine (PEPCID) 20 MG tablet Take 20 mg by mouth daily.    . ferrous sulfate 325 (65 FE) MG EC tablet Take 325 mg by mouth daily with breakfast. Reported on 03/04/2016    . LORazepam (ATIVAN) 1 MG tablet Take 1 mg by mouth every 8 (eight) hours. PRN    . Multiple Vitamin (MULTIVITAMIN) tablet Take 1 tablet by  mouth daily.    . phenylephrine (SUDAFED PE) 10 MG TABS tablet Take 10 mg by mouth daily.    Letta Pate. ONETOUCH DELICA LANCETS 33G MISC TEST BLOOD SUGAR ONCE DAILY  4  . ONETOUCH VERIO test strip TEST BLOOD SUGAR ONCE DAILY  4   No current facility-administered medications for this visit.      ALLERGIES: Patient has no known allergies.  Family History  Problem Relation Age of Onset  . Heart disease Father   . Hyperlipidemia Father   . Multiple births Maternal Grandmother   . Hypertension Maternal Grandmother   . Stroke Maternal Grandmother   . Multiple births Paternal Grandmother   . Diabetes Paternal Grandmother   . Heart disease Paternal Grandfather   . Heart attack Paternal Grandfather   . Hyperlipidemia Sister   . Hyperlipidemia Mother        episode of A - fib  . Heart disease Maternal Uncle        A-Fib  . Heart disease Maternal Uncle   . Heart disease Maternal Uncle     Social History   Social History  . Marital status: Single  Spouse name: N/A  . Number of children: N/A  . Years of education: N/A   Occupational History  . Not on file.   Social History Main Topics  . Smoking status: Never Smoker  . Smokeless tobacco: Never Used  . Alcohol use No  . Drug use: No  . Sexual activity: No   Other Topics Concern  . Not on file   Social History Narrative  . No narrative on file    ROS:  Pertinent items are noted in HPI.  PHYSICAL EXAMINATION:    BP 116/80 (BP Location: Right Arm, Patient Position: Sitting, Cuff Size: Normal)   Pulse 68   Resp 16   Wt (!) 382 lb (173.3 kg)   LMP 02/22/2017 Comment: occasional spotting  BMI 56.82 kg/m     General appearance: alert, cooperative and appears stated age  Pelvic ultrasound: Uterus with no masses.  EMS 12 mm.  Ovaries normal. No free fluid.  EMB: Minor bleeding from cervix noted.  Sterile prep of cervix with Hibiclens.  Paracervical block with 10 cc 1% lidocaine. Lot number [id number], expiration Jan  2020. Tenaculum to anterior cervical lip.  Pipelle passed x 2.  Tissue to pathology.  No complications.  Minimal EBL.  Chaperone was present for exam.  ASSESSMENT  Oligomenorrhea.  Obesity.  At increased risk of endometrial hyperplasia.  PLAN  Check TSH, prolactin, LH, FSH, estradiol. Follow up EMB.  Discussed potential tx with Micronor, periodic Provera, Nexplanon, Mirena IUD.  She is interested in [place].  We discussed risks and benefits in detail.  Mirena brochure given.  If she is still not sexually active and desires Mirena, this can be placed when she is not on her menses.  An After Visit Summary was printed and given to the patient.  __15____ minutes face to face time of which over 50% was spent in counseling.

## 2017-04-01 NOTE — Patient Instructions (Signed)

## 2017-04-01 NOTE — Progress Notes (Signed)
Encounter reviewed by Dr. Brook Amundson C. Silva.  

## 2017-04-02 LAB — PROLACTIN: Prolactin: 14.9 ng/mL (ref 4.8–23.3)

## 2017-04-02 LAB — FSH/LH
FSH: 9.6 m[IU]/mL
LH: 9 m[IU]/mL

## 2017-04-02 LAB — ESTRADIOL: Estradiol: 34.5 pg/mL

## 2017-04-02 LAB — TSH: TSH: 2.01 u[IU]/mL (ref 0.450–4.500)

## 2017-04-05 ENCOUNTER — Telehealth: Payer: Self-pay

## 2017-04-05 NOTE — Telephone Encounter (Signed)
Spoke with patient and scheduled Mirena IUD insertion 04-09-17 3:30pm. Advised patient to take 800mg  Ibuprofen prior to appointment.

## 2017-04-05 NOTE — Telephone Encounter (Signed)
OK for Mirena IUD this week with me.

## 2017-04-05 NOTE — Telephone Encounter (Signed)
Spoke with patient and reviewed results per Dr.Silva's note below. Patient states she called her insurance company and states they will cover Mirena IUD 100% and would like to proceed with Mirena. She states LMP 04-01-17 and would like IUD inserted this week if possible.  Will discuss with Dr.Silva and call her back. Will route to Ins.Dept.to verify benefits and to Dr.Silva.

## 2017-04-05 NOTE — Telephone Encounter (Signed)
-----   Message from Patton SallesBrook E Amundson C Silva, MD sent at 04/04/2017 11:24 AM EDT ----- Please report results to patient.  Labs show she may be entering perimenopause due to the rising LH and FSH and lowered estrogen level.   Her body may have wide fluctuations of hormones during this time.  Her endometrial biopsy showed proliferative change, which represents estrogen effect and potential lack of ovulation.  The Mirena is an excellent plan to help with the transition into eventual menopause and protect against heavy periods and possible endometrial hyperplasia. She may also choose to call the office if she skips her cycle for 3 months at a time and then receive an Rx for Provera 10 mg x 12 days.

## 2017-04-08 ENCOUNTER — Telehealth: Payer: Self-pay | Admitting: Obstetrics and Gynecology

## 2017-04-08 NOTE — Telephone Encounter (Signed)
Spoke with patient regarding benefit for a Mirena IUD insertion. Patient understood information presented. Patient has elected not to proceed with IUD insertion. Patient would like to know if there are any other treatment options.  Routing to Dr Edward JollySilva

## 2017-04-09 ENCOUNTER — Ambulatory Visit: Payer: 59 | Admitting: Obstetrics and Gynecology

## 2017-04-09 NOTE — Telephone Encounter (Signed)
Please reach out to the patient and offer Micronor, progesterone only oral contraceptive.  This is a pill she would take daily as there are no placebo pills at the end of the pack.  She just need to be on time with each dose and has about a 6 hour window to take the medicine.  She may have refills until her annual exam is due in one year.

## 2017-04-13 MED ORDER — NORETHINDRONE 0.35 MG PO TABS: 1.0000 | ORAL_TABLET | Freq: Every day | ORAL | 3 refills | Status: DC

## 2017-04-13 NOTE — Telephone Encounter (Signed)
Left message to call Kamyla Olejnik at [phone]. 

## 2017-04-13 NOTE — Telephone Encounter (Signed)
Spoke with patient. Advised of message as seen below from Dr.Silva. Patient verbalizes understanding and would like to start Micronor. Rx for Micronor take 1 tablet daily #3 3RF sent to pharmacy on file.  Routing to provider for final review. Patient agreeable to disposition. Will close encounter.

## 2017-10-11 ENCOUNTER — Other Ambulatory Visit: Payer: Self-pay | Admitting: Obstetrics and Gynecology

## 2017-10-11 DIAGNOSIS — Z139 Encounter for screening, unspecified: Secondary | ICD-10-CM

## 2017-11-08 ENCOUNTER — Ambulatory Visit
Admission: RE | Admit: 2017-11-08 | Discharge: 2017-11-08 | Disposition: A | Discharge status: Home / Self Care | Payer: Commercial Managed Care - HMO | Source: Ambulatory Visit | Attending: Obstetrics and Gynecology | Admitting: Obstetrics and Gynecology

## 2017-11-08 DIAGNOSIS — Z139 Encounter for screening, unspecified: Secondary | ICD-10-CM

## 2018-03-07 ENCOUNTER — Other Ambulatory Visit: Payer: Self-pay | Admitting: Obstetrics and Gynecology

## 2018-03-07 NOTE — Telephone Encounter (Signed)
Medication refill request: Debra French 0.35 Last AEX:  03/10/17 Next AEX: 03/14/18 Last MMG (if hormonal medication request): 11/08/17 Bi- rads Category 1 Neg  Refill authorized: Please refill until her AEX if Appropriate.

## 2018-03-14 ENCOUNTER — Ambulatory Visit: Payer: 59 | Admitting: Obstetrics and Gynecology

## 2018-03-14 ENCOUNTER — Other Ambulatory Visit (HOSPITAL_COMMUNITY)
Admission: RE | Admit: 2018-03-14 | Discharge: 2018-03-14 | Disposition: A | Discharge status: Home / Self Care | Payer: 59 | Source: Ambulatory Visit | Attending: Obstetrics and Gynecology | Admitting: Obstetrics and Gynecology

## 2018-03-14 ENCOUNTER — Encounter: Payer: Self-pay | Admitting: Obstetrics and Gynecology

## 2018-03-14 VITALS — BP 138/88 | HR 76 | Ht 69.0 in | Wt 376.0 lb

## 2018-03-14 DIAGNOSIS — Z01419 Encounter for gynecological examination (general) (routine) without abnormal findings: Secondary | ICD-10-CM | POA: Insufficient documentation

## 2018-03-14 MED ORDER — NORETHINDRONE ACETATE 5 MG PO TABS: 5.0000 mg | ORAL_TABLET | Freq: Every day | ORAL | 0 refills | Status: DC

## 2018-03-14 NOTE — Progress Notes (Signed)
47 y.o. G1P1 Single Caucasian female here for annual exam.    Menses in Feb., May 24 x 14 days, June 7 x 11 days and was heavy.  Spotting again today.  End of April was sick for one week and had vomiting so may not have had regular absorption of pills.  Hx oligomenorrhea.  EMB showed proliferative change.   Feels lack of motivation and in a funk.  On Celexa.   Hgb A1C 6.4.  PCP:   Dr Wylene Simmer  LMP: 03/14/18           Sexually active: No.  The current method of family planning is Rx Camila    Exercising: No.   Smoker:  no  Health Maintenance: Pap:  02/28/15 Neg. HR HPV:neg History of abnormal Pap:  Yes, years ago MMG:  11/08/17 BIRADS 1 negative/density a TDaP:  2011 Gardasil:   no HIV and Hep C: 03/11/15 Negative Screening Labs:  Discuss today   reports that she has never smoked. She has never used smokeless tobacco. She reports that she does not drink alcohol or use drugs.  Past Medical History:  Diagnosis Date  . Anxiety   . Elevated hemoglobin A1c   . Genital warts   . Obesity 03/1998  . Reflux   . STD (sexually transmitted disease) 11/27/97   Hx of Chlamydia    Past Surgical History:  Procedure Laterality Date  . COLPOSCOPY  05/1998   neg ECC, neg Bx, CIN I    Current Outpatient Medications  Medication Sig Dispense Refill  . Calcium-Magnesium-Vitamin D (CALCIUM 500 PO) Take by mouth.    Marland Kitchen CAMILA 0.35 MG tablet TAKE 1 TABLET BY MOUTH EVERY DAY 28 tablet 0  . Cetirizine HCl 10 MG CAPS Take 10 mg by mouth as needed.     . Cholecalciferol (VITAMIN D-3) 5000 UNITS TABS Take 1 tablet by mouth daily.    . citalopram (CELEXA) 40 MG tablet Take 1 tablet by mouth daily.    . famotidine (PEPCID) 20 MG tablet Take 20 mg by mouth daily.    . ferrous sulfate 325 (65 FE) MG EC tablet Take 325 mg by mouth daily with breakfast. Reported on 03/04/2016    . LORazepam (ATIVAN) 1 MG tablet Take 1 mg by mouth every 8 (eight) hours. PRN    . Multiple Vitamin (MULTIVITAMIN) tablet  Take 1 tablet by mouth daily.    Letta Pate DELICA LANCETS 33G MISC TEST BLOOD SUGAR ONCE DAILY  4  . ONETOUCH VERIO test strip TEST BLOOD SUGAR ONCE DAILY  4  . phenylephrine (SUDAFED PE) 10 MG TABS tablet Take 10 mg by mouth daily.     No current facility-administered medications for this visit.     Family History  Problem Relation Age of Onset  . Heart disease Father   . Hyperlipidemia Father   . Multiple births Maternal Grandmother   . Hypertension Maternal Grandmother   . Stroke Maternal Grandmother   . Multiple births Paternal Grandmother   . Diabetes Paternal Grandmother   . Heart disease Paternal Grandfather   . Heart attack Paternal Grandfather   . Hyperlipidemia Sister   . Hyperlipidemia Mother        episode of A - fib  . Heart disease Maternal Uncle        A-Fib  . Heart disease Maternal Uncle   . Heart disease Maternal Uncle   . Breast cancer Neg Hx     Review of Systems  Constitutional: Negative.  HENT: Negative.   Eyes:       Sinusitis  Respiratory: Negative.   Cardiovascular: Negative.   Gastrointestinal: Negative.   Endocrine: Negative.   Genitourinary: Positive for menstrual problem.  Musculoskeletal: Negative.   Skin: Negative.   Allergic/Immunologic: Negative.   Neurological:       Depression  Hematological: Negative.   Psychiatric/Behavioral: Negative.     Exam:   BP 138/88 (BP Location: Left Arm, Patient Position: Sitting, Cuff Size: Large)   Pulse 76   Ht 5\' 9"  (1.753 m)   Wt (!) 376 lb (170.6 kg)   LMP 03/14/2018 (Exact Date) Comment: spotting  BMI 55.53 kg/m     General appearance: alert, cooperative and appears stated age Head: Normocephalic, without obvious abnormality, atraumatic Neck: no adenopathy, supple, symmetrical, trachea midline and thyroid normal to inspection and palpation Lungs: clear to auscultation bilaterally Breasts: normal appearance, no masses or tenderness, No nipple retraction or dimpling, No nipple discharge  or bleeding, No axillary or supraclavicular adenopathy Heart: regular rate and rhythm Abdomen: soft, non-tender; no masses, no organomegaly Extremities: extremities normal, atraumatic, no cyanosis or edema Skin: Skin color, texture, turgor normal. No rashes or lesions Lymph nodes: Cervical, supraclavicular, and axillary nodes normal. No abnormal inguinal nodes palpated Neurologic: Grossly normal  Pelvic: External genitalia:  no lesions              Urethra:  normal appearing urethra with no masses, tenderness or lesions              Bartholins and Skenes: normal                 Vagina: normal appearing vagina with normal color and discharge, no lesions              Cervix: no lesions.  Mild bleeding.               Pap taken: Yes.   Bimanual Exam:  Uterus:  normal size, contour, position, consistency, mobility, non-tender.  Exam [facility] by [facility].               Adnexa: no mass, fullness, tenderness              Rectal exam: Yes.  .  Confirms.              Anus:  normal sphincter tone, no lesions  Chaperone was present for exam.  Assessment:   Well woman visit with normal exam. Remote hx of abnormal pap.  Oligomenorrhea.  Negative work up.  On Micronor.  Irregular menses.  Hx depression.   Plan: Mammogram screening. Recommended self breast awareness. Pap and HR HPV as above. Guidelines for Calcium, Vitamin D, regular exercise program including cardiovascular and weight bearing exercise. Will switch from Camilla to Aygestin 5 mg daily. #90. She understands that this is not a contraceptive.  Patient will report back how her cycles are doing, so I can make an assessment of her ongoing needs.  She will follow up with her PCP regarding her depression and lack of motivation.  Follow up annually and prn.   After visit summary provided.

## 2018-03-14 NOTE — Patient Instructions (Signed)

## 2018-03-16 LAB — CYTOLOGY - PAP
Diagnosis: NEGATIVE
HPV (WINDOPATH): NOT DETECTED

## 2018-03-28 ENCOUNTER — Other Ambulatory Visit: Payer: Self-pay | Admitting: Obstetrics and Gynecology

## 2018-06-08 ENCOUNTER — Other Ambulatory Visit: Payer: Self-pay | Admitting: Obstetrics and Gynecology

## 2018-06-08 NOTE — Telephone Encounter (Signed)
Called the patient and she states that her cycles are regular and she feels like the Aygestine is helping her.

## 2018-06-08 NOTE — Telephone Encounter (Signed)
Medication refill request: Norethindrone Last AEX:  03/14/18 Next AEX: 03/20/19 Last MMG (if hormonal medication request):11/08/17  Bi-rads 1 neg  Refill authorized:  #90 with 0 RF

## 2018-06-08 NOTE — Telephone Encounter (Signed)
Please contact patient and get an update on her menstrual cycles on the Aygestin.  She had irregular bleeding on Micronor, and I switched her to Aygestin.

## 2018-10-11 ENCOUNTER — Other Ambulatory Visit (HOSPITAL_COMMUNITY): Payer: Self-pay | Admitting: Internal Medicine

## 2018-10-11 ENCOUNTER — Other Ambulatory Visit: Payer: Self-pay | Admitting: Internal Medicine

## 2018-10-11 ENCOUNTER — Ambulatory Visit (HOSPITAL_COMMUNITY)
Admission: RE | Admit: 2018-10-11 | Discharge: 2018-10-11 | Disposition: A | Discharge status: Home / Self Care | Payer: Managed Care, Other (non HMO) | Source: Ambulatory Visit | Attending: Internal Medicine | Admitting: Internal Medicine

## 2018-10-11 DIAGNOSIS — R109 Unspecified abdominal pain: Secondary | ICD-10-CM | POA: Diagnosis not present

## 2018-11-04 ENCOUNTER — Ambulatory Visit: Payer: Self-pay | Admitting: Surgery

## 2018-11-04 NOTE — H&P (View-Only) (Signed)
Debra French Documented: 11/04/2018 10:23 AM Location: [facility] Patient #: [id number] DOB: May 06, 1971 Single / Language: [place] / Race: White Female  History of Present Illness (Keanen Dohse A. Fredricka Bonine MD; 11/04/2018 10:46 AM) Patient words: This is a very pleasant 48 year old woman who is referred for cholecystectomy. About 3 weeks ago she had an attack of severe right upper quadrant pain radiating to her back, associated with severe vomiting and nausea. This lasted for about 9 hours, and then subsided but she continued to have pain for the next 2 or 3 days. Lab work revealed normal LFTs but her elevated white blood cell count of almost 14, she underwent an ultrasound that shows multiple gallstones largest of which is 3.2 cm and impacted in the gallbladder neck. Presently she is back to baseline and not currently having any pain. Denies any fevers or jaundice. She has not had any prior abdominal surgeries. Denies any alcohol or tobacco use. She works as a Designer, industrial/product and works the night shift. 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  The patient is a 48 year old female.   Past Surgical History Renee Ramus, CMA; 11/04/2018 10:23 AM) Knee Surgery Right. Oral Surgery Tonsillectomy  Diagnostic Studies History Renee Ramus, CMA; 11/04/2018 10:23 AM) Colonoscopy never Mammogram 1-3 years ago Pap Smear 1-5 years ago  Allergies Renee Ramus, CMA; 11/04/2018 10:26 AM) No Known Drug Allergies [11/04/2018]:  Medication History (Armen Ferguson, CMA; 11/04/2018 10:28 AM) Citalopram Hydrobromide (40MG  Tablet, Oral) Active. Pepcid (20MG  Tablet, Oral) Active. Ferrous Sulfate (325 (65 Fe)MG Tablet, Oral) Active. metFORMIN HCl (500MG  Tablet, Oral) Active. Medications Reconciled  Social History Renee Ramus, CMA; 11/04/2018 10:23 AM) Caffeine use Carbonated beverages. No alcohol use No drug use Tobacco use Never smoker.  Family History Renee Ramus, CMA;  11/04/2018 10:23 AM) Heart Disease Father.  Pregnancy / Birth History Renee Ramus, CMA; 11/04/2018 10:23 AM) Age at menarche 13 years. Gravida 1 Irregular periods Length (months) of breastfeeding 3-6 Maternal age 52-35 Para 1  Other Problems Renee Ramus, CMA; 11/04/2018 10:23 AM) Anxiety Disorder Cholelithiasis Diabetes Mellitus Gastroesophageal Reflux Disease     Review of Systems (Kaula Klenke A. Wandalee Klang MD; 11/04/2018 10:46 AM) General Not Present- Appetite Loss, Chills, Fatigue, Fever, Night Sweats, Weight Gain and Weight Loss. Skin Present- Dryness. Not Present- Change in Wart/Mole, Hives, Jaundice, New Lesions, Non-Healing Wounds, Rash and Ulcer. HEENT Present- Seasonal Allergies. Not Present- Earache, Hearing Loss, Hoarseness, Nose Bleed, Oral Ulcers, Ringing in the Ears, Sinus Pain, Sore Throat, Visual Disturbances, Wears glasses/contact lenses and Yellow Eyes. Respiratory Not Present- Bloody sputum, Chronic Cough, Difficulty Breathing, Snoring and Wheezing. Breast Not Present- Breast Mass, Breast Pain, Nipple Discharge and Skin Changes. Cardiovascular Not Present- Chest Pain, Difficulty Breathing Lying Down, Leg Cramps, Palpitations, Rapid Heart Rate, Shortness of Breath and Swelling of Extremities. Gastrointestinal Not Present- Abdominal Pain, Bloating, Bloody Stool, Change in Bowel Habits, Chronic diarrhea, Constipation, Difficulty Swallowing, Excessive gas, Gets full quickly at meals, Hemorrhoids, Indigestion, Nausea, Rectal Pain and Vomiting. Female Genitourinary Not Present- Frequency, Nocturia, Painful Urination, Pelvic Pain and Urgency. Musculoskeletal Not Present- Back Pain, Joint Pain, Joint Stiffness, Muscle Pain, Muscle Weakness and Swelling of Extremities. Neurological Not Present- Decreased Memory, Fainting, Headaches, Numbness, Seizures, Tingling, Tremor, Trouble walking and Weakness. Psychiatric Present- Anxiety. Not Present- Bipolar, Change in Sleep  Pattern, Depression, Fearful and Frequent crying. Endocrine Not Present- Cold Intolerance, Excessive Hunger, Hair Changes, Heat Intolerance, Hot flashes and New Diabetes. Hematology Not Present- Blood Thinners, Easy Bruising, Excessive bleeding, Gland problems, HIV  and Persistent Infections. All other systems negative  Vitals (Armen Ferguson CMA; 11/04/2018 10:24 AM) 11/04/2018 10:23 AM Weight: 361 lb Height: 69in Body Surface Area: 2.65 m Body Mass Index: 53.31 kg/m  Temp.: 98.88F  Pulse: 94 (Regular)  P.OX: 98% (Room air) BP: 136/88 (Sitting, Left Arm, Standard)      Physical Exam (Kamsiyochukwu Buist A. Fredricka Bonine MD; 11/04/2018 10:47 AM)  The physical exam findings are as follows: Note:Gen: alert and well appearing Eye: extraocular motion intact, no scleral icterus ENT: moist mucus membranes, dentition intact Neck: no mass or thyromegaly Chest: unlabored respirations, symmetrical air entry, clear bilaterally CV: regular rate and rhythm, no pedal edema Abdomen: soft, obese, mildly tender in the right subcostal margin, nondistended. No mass or organomegaly MSK: strength symmetrical throughout, no deformity Neuro: grossly intact, normal gait Psych: normal mood and affect, appropriate insight Skin: warm and dry, no rash or lesion on limited exam    Assessment & Plan (Lexie Morini A. Mykalah Saari MD; 11/04/2018 10:47 AM)  CHOLECYSTITIS (K81.9) Story: She likely had cholecystitis given the appearance of her ultrasound and elevated white blood cell count. Fortunately she seems to be back to baseline at this point. I recommend proceeding with laparoscopic cholecystectomy with possible cholangiogram. Discussed risks of surgery including bleeding, pain, scarring, intraabdominal injury specifically to the common bile duct and sequelae, conversion to open surgery, failure to resolve symptoms, blood clots/ pulmonary embolus, heart attack, pneumonia, stroke, death. Questions welcomed and answered to patient's  satisfaction.

## 2018-11-04 NOTE — H&P (Signed)
Debra French Documented: 11/04/2018 10:23 AM Location: [facility] Patient #: [id number] DOB: May 06, 1971 Single / Language: [place] / Race: White Female  History of Present Illness (Debra French A. Debra Bonine MD; 11/04/2018 10:46 AM) Patient words: This is a very pleasant 48 year old woman who is referred for cholecystectomy. About 3 weeks ago she had an attack of severe right upper quadrant pain radiating to her back, associated with severe vomiting and nausea. This lasted for about 9 hours, and then subsided but she continued to have pain for the next 2 or 3 days. Lab work revealed normal LFTs but her elevated white blood cell count of almost 14, she underwent an ultrasound that shows multiple gallstones largest of which is 3.2 cm and impacted in the gallbladder neck. Presently she is back to baseline and not currently having any pain. Denies any fevers or jaundice. She has not had any prior abdominal surgeries. Denies any alcohol or tobacco use. She works as a Designer, industrial/product and works the night shift. 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  The patient is a 48 year old female.   Past Surgical History Debra French, French; 11/04/2018 10:23 AM) Knee Surgery Right. Oral Surgery Tonsillectomy  Diagnostic Studies History Debra French, French; 11/04/2018 10:23 AM) Colonoscopy never Mammogram 1-3 years ago Pap Smear 1-5 years ago  Allergies Debra French, French; 11/04/2018 10:26 AM) No Known Drug Allergies [11/04/2018]:  Medication History (Debra French, French; 11/04/2018 10:28 AM) Citalopram Hydrobromide (40MG  Tablet, Oral) Active. Pepcid (20MG  Tablet, Oral) Active. Ferrous Sulfate (325 (65 Fe)MG Tablet, Oral) Active. metFORMIN HCl (500MG  Tablet, Oral) Active. Medications Reconciled  Social History Debra French, French; 11/04/2018 10:23 AM) Caffeine use Carbonated beverages. No alcohol use No drug use Tobacco use Never smoker.  Family History Debra French, French;  11/04/2018 10:23 AM) Heart Disease Father.  Pregnancy / Birth History Debra French, French; 11/04/2018 10:23 AM) Age at menarche 13 years. Gravida 1 Irregular periods Length (months) of breastfeeding 3-6 Maternal age 52-35 Para 1  Other Problems Debra French, French; 11/04/2018 10:23 AM) Anxiety Disorder Cholelithiasis Diabetes Mellitus Gastroesophageal Reflux Disease     Review of Systems (Debra French A. Debra Henner MD; 11/04/2018 10:46 AM) General Not Present- Appetite Loss, Chills, Fatigue, Fever, Night Sweats, Weight Gain and Weight Loss. Skin Present- Dryness. Not Present- Change in Wart/Mole, Hives, Jaundice, New Lesions, Non-Healing Wounds, Rash and Ulcer. HEENT Present- Seasonal Allergies. Not Present- Earache, Hearing Loss, Hoarseness, Nose Bleed, Oral Ulcers, Ringing in the Ears, Sinus Pain, Sore Throat, Visual Disturbances, Wears glasses/contact lenses and Yellow Eyes. Respiratory Not Present- Bloody sputum, Chronic Cough, Difficulty Breathing, Snoring and Wheezing. Breast Not Present- Breast Mass, Breast Pain, Nipple Discharge and Skin Changes. Cardiovascular Not Present- Chest Pain, Difficulty Breathing Lying Down, Leg Cramps, Palpitations, Rapid Heart Rate, Shortness of Breath and Swelling of Extremities. Gastrointestinal Not Present- Abdominal Pain, Bloating, Bloody Stool, Change in Bowel Habits, Chronic diarrhea, Constipation, Difficulty Swallowing, Excessive gas, Gets full quickly at meals, Hemorrhoids, Indigestion, Nausea, Rectal Pain and Vomiting. Female Genitourinary Not Present- Frequency, Nocturia, Painful Urination, Pelvic Pain and Urgency. Musculoskeletal Not Present- Back Pain, Joint Pain, Joint Stiffness, Muscle Pain, Muscle Weakness and Swelling of Extremities. Neurological Not Present- Decreased Memory, Fainting, Headaches, Numbness, Seizures, Tingling, Tremor, Trouble walking and Weakness. Psychiatric Present- Anxiety. Not Present- Bipolar, Change in Sleep  Pattern, Depression, Fearful and Frequent crying. Endocrine Not Present- Cold Intolerance, Excessive Hunger, Hair Changes, Heat Intolerance, Hot flashes and New Diabetes. Hematology Not Present- Blood Thinners, Easy Bruising, Excessive bleeding, Gland problems, HIV  and Persistent Infections. All other systems negative  Vitals (Debra French; 11/04/2018 10:24 AM) 11/04/2018 10:23 AM Weight: 361 lb Height: 69in Body Surface Area: 2.65 m Body Mass Index: 53.31 kg/m  Temp.: 98.5F  Pulse: 94 (Regular)  P.OX: 98% (Room air) BP: 136/88 (Sitting, Left Arm, Standard)      Physical Exam (Debra French A. Debra Leggitt MD; 11/04/2018 10:47 AM)  The physical exam findings are as follows: Note:Gen: alert and well appearing Eye: extraocular motion intact, no scleral icterus ENT: moist mucus membranes, dentition intact Neck: no mass or thyromegaly Chest: unlabored respirations, symmetrical air entry, clear bilaterally CV: regular rate and rhythm, no pedal edema Abdomen: soft, obese, mildly tender in the right subcostal margin, nondistended. No mass or organomegaly MSK: strength symmetrical throughout, no deformity Neuro: grossly intact, normal gait Psych: normal mood and affect, appropriate insight Skin: warm and dry, no rash or lesion on limited exam    Assessment & Plan (Debra French A. Debra Yurkovich MD; 11/04/2018 10:47 AM)  CHOLECYSTITIS (K81.9) Story: She likely had cholecystitis given the appearance of her ultrasound and elevated white blood cell count. Fortunately she seems to be back to baseline at this point. I recommend proceeding with laparoscopic cholecystectomy with possible cholangiogram. Discussed risks of surgery including bleeding, pain, scarring, intraabdominal injury specifically to the common bile duct and sequelae, conversion to open surgery, failure to resolve symptoms, blood clots/ pulmonary embolus, heart attack, pneumonia, stroke, death. Questions welcomed and answered to patient's  satisfaction. 

## 2018-11-07 ENCOUNTER — Other Ambulatory Visit (HOSPITAL_COMMUNITY): Payer: Self-pay | Admitting: *Deleted

## 2018-11-07 NOTE — Patient Instructions (Addendum)
Debra French  11/07/2018   Your procedure is scheduled on: Wednesday 11/09/2018  Report to [facility] Main  Entrance              Report to admitting at  0930  AM    Call this number if you have problems the morning of surgery [phone]  How to Manage Your Diabetes Before and After Surgery  Why is it important to control my blood sugar before and after surgery? . Improving blood sugar levels before and after surgery helps healing and can limit problems. . A way of improving blood sugar control is eating a healthy diet by: o  Eating less sugar and carbohydrates o  Increasing activity/exercise o  Talking with your doctor about reaching your blood sugar goals . High blood sugars (greater than 180 mg/dL) can raise your risk of infections and slow your recovery, so you will need to focus on controlling your diabetes during the weeks before surgery. . Make sure that the doctor who takes care of your diabetes knows about your planned surgery including the date and location.  How do I manage my blood sugar before surgery? . Check your blood sugar at least 4 times a day, starting 2 days before surgery, to make sure that the level is not too high or low. o Check your blood sugar the morning of your surgery when you wake up and every 2 hours until you get to the Short Stay unit. . If your blood sugar is less than 70 mg/dL, you will need to treat for low blood sugar: o Do not take insulin. o Treat a low blood sugar (less than 70 mg/dL) with  cup of clear juice (cranberry or apple), 4 glucose tablets, OR glucose gel. o Recheck blood sugar in 15 minutes after treatment (to make sure it is greater than 70 mg/dL). If your blood sugar is not greater than 70 mg/dL on recheck, call [phone] for further instructions. . Report your blood sugar to the short stay nurse when you get to Short Stay.  . If you are admitted to the hospital after surgery: o Your blood sugar  will be checked by the staff and you will probably be given insulin after surgery (instead of oral diabetes medicines) to make sure you have good blood sugar levels. o The goal for blood sugar control after surgery is 80-180 mg/dL.   WHAT DO I DO ABOUT MY DIABETES MEDICATION?         The day before surgery, Take Metformin as usual.  . Do not take oral diabetes medicines (pills) the morning of surgery.        Remember: Do not eat food or drink liquids :After Midnight.               BRUSH YOUR TEETH MORNING OF SURGERY AND RINSE YOUR MOUTH OUT, NO CHEWING GUM CANDY OR MINTS.     Take these medicines the morning of surgery with A SIP OF WATER: Citalopram(Celexa), Famotidine (Pepcid),Zyrtec                DO NOT TAKE ANY DIABETIC MEDICATIONS DAY OF YOUR SURGERY!                               You may not have any metal on your body including hair pins and  piercings  Do not wear jewelry, make-up, lotions, powders or perfumes, deodorant             Do not wear nail polish.  Do not shave  48 hours prior to surgery.          Do not bring valuables to the hospital. [facility] IS NOT             RESPONSIBLE   FOR VALUABLES.  Contacts, dentures or bridgework may not be worn into surgery. .     Patients discharged the day of surgery will not be allowed to drive home. IF YOU ARE HAVING SURGERY AND GOING HOME THE SAME DAY, YOU MUST HAVE AN ADULT TO DRIVE YOU HOME AND BE WITH YOU FOR 24 HOURS. YOU MAY GO HOME BY TAXI OR UBER OR ORTHERWISE, BUT AN ADULT MUST ACCOMPANY YOU HOME AND STAY WITH YOU FOR 24 HOURS.  Name and phone number of your driver:               Please read over the following fact sheets you were given: _____________________________________________________________________             [facility] - Preparing for Surgery Before surgery, you can play an important role.  Because skin is not sterile, your skin needs to be as free of germs as possible.  You can reduce  the number of germs on your skin by washing with CHG (chlorahexidine gluconate) soap before surgery.  CHG is an antiseptic cleaner which kills germs and bonds with the skin to continue killing germs even after washing. Please DO NOT use if you have an allergy to CHG or antibacterial soaps.  If your skin becomes reddened/irritated stop using the CHG and inform your nurse when you arrive at Short Stay. Do not shave (including legs and underarms) for at least 48 hours prior to the first CHG shower.  You may shave your face/neck. Please follow these instructions carefully:  1.  Shower with CHG Soap the night before surgery and the  morning of Surgery.  2.  If you choose to wash your hair, wash your hair first as usual with your  normal  shampoo.  3.  After you shampoo, rinse your hair and body thoroughly to remove the  shampoo.                           4.  Use CHG as you would any other liquid soap.  You can apply chg directly  to the skin and wash                       Gently with a scrungie or clean washcloth.  5.  Apply the CHG Soap to your body ONLY FROM THE NECK DOWN.   Do not use on face/ open                           Wound or open sores. Avoid contact with eyes, ears mouth and genitals (private parts).                       Wash face,  Genitals (private parts) with your normal soap.             6.  Wash thoroughly, paying special attention to the area where your surgery  will be performed.  7.  Thoroughly rinse  your body with warm water from the neck down.  8.  DO NOT shower/wash with your normal soap after using and rinsing off  the CHG Soap.                9.  Pat yourself dry with a clean towel.            10.  Wear clean pajamas.            11.  Place clean sheets on your bed the night of your first shower and do not  sleep with pets. Day of Surgery : Do not apply any lotions/deodorants the morning of surgery.  Please wear clean clothes to the hospital/surgery center.  FAILURE TO FOLLOW  THESE INSTRUCTIONS MAY RESULT IN THE CANCELLATION OF YOUR SURGERY PATIENT SIGNATURE_________________________________  NURSE SIGNATURE__________________________________  ________________________________________________________________________

## 2018-11-08 ENCOUNTER — Encounter (HOSPITAL_COMMUNITY)
Admission: RE | Admit: 2018-11-08 | Discharge: 2018-11-08 | Disposition: A | Discharge status: Home / Self Care | Payer: Managed Care, Other (non HMO) | Source: Ambulatory Visit | Attending: Surgery | Admitting: Surgery

## 2018-11-08 ENCOUNTER — Encounter (HOSPITAL_COMMUNITY): Payer: Self-pay

## 2018-11-08 ENCOUNTER — Other Ambulatory Visit: Payer: Self-pay

## 2018-11-08 DIAGNOSIS — Z793 Long term (current) use of hormonal contraceptives: Secondary | ICD-10-CM | POA: Diagnosis not present

## 2018-11-08 DIAGNOSIS — F419 Anxiety disorder, unspecified: Secondary | ICD-10-CM | POA: Diagnosis not present

## 2018-11-08 DIAGNOSIS — E119 Type 2 diabetes mellitus without complications: Secondary | ICD-10-CM | POA: Diagnosis not present

## 2018-11-08 DIAGNOSIS — Z6841 Body Mass Index (BMI) 40.0 and over, adult: Secondary | ICD-10-CM | POA: Diagnosis not present

## 2018-11-08 DIAGNOSIS — Z79899 Other long term (current) drug therapy: Secondary | ICD-10-CM | POA: Diagnosis not present

## 2018-11-08 DIAGNOSIS — K8010 Calculus of gallbladder with chronic cholecystitis without obstruction: Secondary | ICD-10-CM | POA: Diagnosis not present

## 2018-11-08 DIAGNOSIS — K219 Gastro-esophageal reflux disease without esophagitis: Secondary | ICD-10-CM | POA: Diagnosis not present

## 2018-11-08 DIAGNOSIS — Z01818 Encounter for other preprocedural examination: Secondary | ICD-10-CM

## 2018-11-08 DIAGNOSIS — Z7984 Long term (current) use of oral hypoglycemic drugs: Secondary | ICD-10-CM | POA: Diagnosis not present

## 2018-11-08 DIAGNOSIS — K819 Cholecystitis, unspecified: Secondary | ICD-10-CM | POA: Diagnosis present

## 2018-11-08 HISTORY — DX: Pneumonia, unspecified organism: J18.9

## 2018-11-08 HISTORY — DX: Gastro-esophageal reflux disease without esophagitis: K21.9

## 2018-11-08 HISTORY — DX: Other specified postprocedural states: Z98.890

## 2018-11-08 HISTORY — DX: Other specified postprocedural states: R11.2

## 2018-11-08 HISTORY — DX: Type 2 diabetes mellitus without complications: E11.9

## 2018-11-08 LAB — CBC WITH DIFFERENTIAL/PLATELET
Abs Immature Granulocytes: 0.03 10*3/uL (ref 0.00–0.07)
Basophils Absolute: 0 10*3/uL (ref 0.0–0.1)
Basophils Relative: 0 %
Eosinophils Absolute: 0.3 10*3/uL (ref 0.0–0.5)
Eosinophils Relative: 3 %
HCT: 44.2 % (ref 36.0–46.0)
Hemoglobin: 13.5 g/dL (ref 12.0–15.0)
Immature Granulocytes: 0 %
Lymphocytes Relative: 27 %
Lymphs Abs: 2.7 10*3/uL (ref 0.7–4.0)
MCH: 26.4 pg (ref 26.0–34.0)
MCHC: 30.5 g/dL (ref 30.0–36.0)
MCV: 86.3 fL (ref 80.0–100.0)
Monocytes Absolute: 0.6 10*3/uL (ref 0.1–1.0)
Monocytes Relative: 6 %
NRBC: 0 % (ref 0.0–0.2)
Neutro Abs: 6.5 10*3/uL (ref 1.7–7.7)
Neutrophils Relative %: 64 %
Platelets: 327 10*3/uL (ref 150–400)
RBC: 5.12 MIL/uL — ABNORMAL HIGH (ref 3.87–5.11)
RDW: 13.9 % (ref 11.5–15.5)
WBC: 10.2 10*3/uL (ref 4.0–10.5)

## 2018-11-08 LAB — COMPREHENSIVE METABOLIC PANEL
ALT: 24 U/L (ref 0–44)
AST: 21 U/L (ref 15–41)
Albumin: 4.4 g/dL (ref 3.5–5.0)
Alkaline Phosphatase: 69 U/L (ref 38–126)
Anion gap: 8 (ref 5–15)
BUN: 15 mg/dL (ref 6–20)
CO2: 27 mmol/L (ref 22–32)
Calcium: 9.1 mg/dL (ref 8.9–10.3)
Chloride: 104 mmol/L (ref 98–111)
Creatinine, Ser: 0.66 mg/dL (ref 0.44–1.00)
GFR calc Af Amer: >60 mL/min (ref >60)
GFR calc non Af Amer: >60 mL/min (ref >60)
Glucose, Bld: 139 mg/dL — ABNORMAL HIGH (ref 70–99)
Potassium: 4 mmol/L (ref 3.5–5.1)
Sodium: 139 mmol/L (ref 135–145)
Total Bilirubin: 0.6 mg/dL (ref 0.3–1.2)
Total Protein: 7.5 g/dL (ref 6.5–8.1)

## 2018-11-08 LAB — HCG, SERUM, QUALITATIVE: Preg, Serum: NEGATIVE

## 2018-11-08 LAB — GLUCOSE, CAPILLARY: Glucose-Capillary: 115 mg/dL — ABNORMAL HIGH (ref 70–99)

## 2018-11-08 LAB — HEMOGLOBIN A1C
Hgb A1c MFr Bld: 5.9 % — ABNORMAL HIGH (ref 4.8–5.6)
Mean Plasma Glucose: 122.63 mg/dL

## 2018-11-08 MED ORDER — DEXTROSE 5 % IV SOLN
3.0000 g | INTRAVENOUS | Status: AC
  Administered 2018-11-09: 3 g via INTRAVENOUS
  Filled 2018-11-08: qty 3

## 2018-11-08 MED ORDER — BUPIVACAINE LIPOSOME 1.3 % IJ SUSP
20.0000 mL | INTRAMUSCULAR | Status: DC
  Filled 2018-11-08: qty 20

## 2018-11-09 ENCOUNTER — Other Ambulatory Visit: Payer: Self-pay

## 2018-11-09 ENCOUNTER — Ambulatory Visit (HOSPITAL_COMMUNITY): Payer: Managed Care, Other (non HMO) | Admitting: Physician Assistant

## 2018-11-09 ENCOUNTER — Encounter (HOSPITAL_COMMUNITY)
Admission: RE | Disposition: A | Discharge status: Home / Self Care | Payer: Self-pay | Source: Home / Self Care | Attending: Surgery

## 2018-11-09 ENCOUNTER — Ambulatory Visit (HOSPITAL_COMMUNITY)
Admission: RE | Admit: 2018-11-09 | Discharge: 2018-11-09 | Disposition: A | Discharge status: Home / Self Care | Payer: Managed Care, Other (non HMO) | Attending: Surgery | Admitting: Surgery

## 2018-11-09 ENCOUNTER — Encounter (HOSPITAL_COMMUNITY): Payer: Self-pay

## 2018-11-09 ENCOUNTER — Ambulatory Visit (HOSPITAL_COMMUNITY): Payer: Managed Care, Other (non HMO) | Admitting: Certified Registered Nurse Anesthetist

## 2018-11-09 DIAGNOSIS — Z6841 Body Mass Index (BMI) 40.0 and over, adult: Secondary | ICD-10-CM | POA: Insufficient documentation

## 2018-11-09 DIAGNOSIS — Z79899 Other long term (current) drug therapy: Secondary | ICD-10-CM | POA: Insufficient documentation

## 2018-11-09 DIAGNOSIS — E119 Type 2 diabetes mellitus without complications: Secondary | ICD-10-CM | POA: Insufficient documentation

## 2018-11-09 DIAGNOSIS — Z7984 Long term (current) use of oral hypoglycemic drugs: Secondary | ICD-10-CM | POA: Insufficient documentation

## 2018-11-09 DIAGNOSIS — K8010 Calculus of gallbladder with chronic cholecystitis without obstruction: Secondary | ICD-10-CM | POA: Insufficient documentation

## 2018-11-09 DIAGNOSIS — K219 Gastro-esophageal reflux disease without esophagitis: Secondary | ICD-10-CM | POA: Insufficient documentation

## 2018-11-09 DIAGNOSIS — Z793 Long term (current) use of hormonal contraceptives: Secondary | ICD-10-CM | POA: Insufficient documentation

## 2018-11-09 DIAGNOSIS — F419 Anxiety disorder, unspecified: Secondary | ICD-10-CM | POA: Insufficient documentation

## 2018-11-09 HISTORY — PX: CHOLECYSTECTOMY: SHX55

## 2018-11-09 LAB — GLUCOSE, CAPILLARY
Glucose-Capillary: 119 mg/dL — ABNORMAL HIGH (ref 70–99)
Glucose-Capillary: 187 mg/dL — ABNORMAL HIGH (ref 70–99)

## 2018-11-09 SURGERY — LAPAROSCOPIC CHOLECYSTECTOMY
Anesthesia: General

## 2018-11-09 MED ORDER — PROPOFOL 10 MG/ML IV BOLUS
INTRAVENOUS | Status: AC
  Filled 2018-11-09: qty 40

## 2018-11-09 MED ORDER — HYDROCODONE-ACETAMINOPHEN 5-325 MG PO TABS: 1.0000 | ORAL_TABLET | Freq: Four times a day (QID) | ORAL | 0 refills | Status: DC | PRN

## 2018-11-09 MED ORDER — MIDAZOLAM HCL 5 MG/5ML IJ SOLN
INTRAMUSCULAR | Status: DC | PRN
  Administered 2018-11-09: 2 mg via INTRAVENOUS

## 2018-11-09 MED ORDER — CHLORHEXIDINE GLUCONATE CLOTH 2 % EX PADS: 6.0000 | MEDICATED_PAD | Freq: Once | CUTANEOUS | Status: DC

## 2018-11-09 MED ORDER — FENTANYL CITRATE (PF) 250 MCG/5ML IJ SOLN
INTRAMUSCULAR | Status: AC
  Filled 2018-11-09: qty 5

## 2018-11-09 MED ORDER — BUPIVACAINE HCL (PF) 0.25 % IJ SOLN
INTRAMUSCULAR | Status: AC
  Filled 2018-11-09: qty 30

## 2018-11-09 MED ORDER — PROMETHAZINE HCL 25 MG/ML IJ SOLN: 6.2500 mg | INTRAMUSCULAR | Status: DC | PRN

## 2018-11-09 MED ORDER — ACETAMINOPHEN 325 MG PO TABS: 650.0000 mg | ORAL_TABLET | ORAL | Status: DC | PRN

## 2018-11-09 MED ORDER — SUCCINYLCHOLINE CHLORIDE 200 MG/10ML IV SOSY
PREFILLED_SYRINGE | INTRAVENOUS | Status: DC | PRN
  Administered 2018-11-09: 200 mg via INTRAVENOUS

## 2018-11-09 MED ORDER — BUPIVACAINE-EPINEPHRINE (PF) 0.25% -1:200000 IJ SOLN
INTRAMUSCULAR | Status: AC
  Filled 2018-11-09: qty 30

## 2018-11-09 MED ORDER — FENTANYL CITRATE (PF) 100 MCG/2ML IJ SOLN: 25.0000 ug | INTRAMUSCULAR | Status: DC | PRN

## 2018-11-09 MED ORDER — ACETAMINOPHEN 500 MG PO TABS
1000.0000 mg | ORAL_TABLET | ORAL | Status: AC
  Administered 2018-11-09: 1000 mg via ORAL
  Filled 2018-11-09: qty 2

## 2018-11-09 MED ORDER — ROCURONIUM BROMIDE 50 MG/5ML IV SOSY
PREFILLED_SYRINGE | INTRAVENOUS | Status: DC | PRN
  Administered 2018-11-09: 20 mg via INTRAVENOUS
  Administered 2018-11-09 (×2): 10 mg via INTRAVENOUS
  Administered 2018-11-09: 70 mg via INTRAVENOUS

## 2018-11-09 MED ORDER — ONDANSETRON HCL 4 MG/2ML IJ SOLN
INTRAMUSCULAR | Status: AC
  Filled 2018-11-09: qty 2

## 2018-11-09 MED ORDER — LIDOCAINE 2% (20 MG/ML) 5 ML SYRINGE
INTRAMUSCULAR | Status: DC | PRN
  Administered 2018-11-09: 100 mg via INTRAVENOUS

## 2018-11-09 MED ORDER — DEXAMETHASONE SODIUM PHOSPHATE 10 MG/ML IJ SOLN
INTRAMUSCULAR | Status: AC
  Filled 2018-11-09: qty 1

## 2018-11-09 MED ORDER — ROCURONIUM BROMIDE 100 MG/10ML IV SOLN
INTRAVENOUS | Status: AC
  Filled 2018-11-09: qty 1

## 2018-11-09 MED ORDER — LACTATED RINGERS IV SOLN
INTRAVENOUS | Status: AC | PRN
  Administered 2018-11-09: 1000 mL via INTRAVENOUS

## 2018-11-09 MED ORDER — PROPOFOL 10 MG/ML IV BOLUS
INTRAVENOUS | Status: DC | PRN
  Administered 2018-11-09: 250 mg via INTRAVENOUS

## 2018-11-09 MED ORDER — MIDAZOLAM HCL 2 MG/2ML IJ SOLN: 0.5000 mg | Freq: Once | INTRAMUSCULAR | Status: DC | PRN

## 2018-11-09 MED ORDER — LACTATED RINGERS IV SOLN
INTRAVENOUS | Status: DC
  Administered 2018-11-09: 10:00:00 via INTRAVENOUS

## 2018-11-09 MED ORDER — SUGAMMADEX SODIUM 500 MG/5ML IV SOLN
INTRAVENOUS | Status: AC
  Filled 2018-11-09: qty 5

## 2018-11-09 MED ORDER — ACETAMINOPHEN 650 MG RE SUPP
650.0000 mg | RECTAL | Status: DC | PRN
  Filled 2018-11-09: qty 1

## 2018-11-09 MED ORDER — SCOPOLAMINE 1 MG/3DAYS TD PT72
MEDICATED_PATCH | TRANSDERMAL | Status: AC
  Filled 2018-11-09: qty 1

## 2018-11-09 MED ORDER — BUPIVACAINE-EPINEPHRINE 0.25% -1:200000 IJ SOLN
INTRAMUSCULAR | Status: DC | PRN
  Administered 2018-11-09: 30 mL

## 2018-11-09 MED ORDER — LIDOCAINE 2% (20 MG/ML) 5 ML SYRINGE
INTRAMUSCULAR | Status: AC
  Filled 2018-11-09: qty 5

## 2018-11-09 MED ORDER — SODIUM CHLORIDE 0.9% FLUSH: 3.0000 mL | INTRAVENOUS | Status: DC | PRN

## 2018-11-09 MED ORDER — FENTANYL CITRATE (PF) 100 MCG/2ML IJ SOLN
INTRAMUSCULAR | Status: DC | PRN
  Administered 2018-11-09 (×5): 50 ug via INTRAVENOUS

## 2018-11-09 MED ORDER — SUGAMMADEX SODIUM 200 MG/2ML IV SOLN
INTRAVENOUS | Status: DC | PRN
  Administered 2018-11-09: 400 mg via INTRAVENOUS

## 2018-11-09 MED ORDER — SODIUM CHLORIDE 0.9 % IV SOLN: 250.0000 mL | INTRAVENOUS | Status: DC | PRN

## 2018-11-09 MED ORDER — CELECOXIB 200 MG PO CAPS
200.0000 mg | ORAL_CAPSULE | ORAL | Status: AC
  Administered 2018-11-09: 200 mg via ORAL
  Filled 2018-11-09: qty 1

## 2018-11-09 MED ORDER — EPHEDRINE SULFATE-NACL 50-0.9 MG/10ML-% IV SOSY
PREFILLED_SYRINGE | INTRAVENOUS | Status: DC | PRN
  Administered 2018-11-09: 10 mg via INTRAVENOUS

## 2018-11-09 MED ORDER — OXYCODONE HCL 5 MG PO TABS: 5.0000 mg | ORAL_TABLET | ORAL | Status: DC | PRN

## 2018-11-09 MED ORDER — DEXAMETHASONE SODIUM PHOSPHATE 10 MG/ML IJ SOLN
INTRAMUSCULAR | Status: DC | PRN
  Administered 2018-11-09: 5 mg via INTRAVENOUS

## 2018-11-09 MED ORDER — DOCUSATE SODIUM 100 MG PO CAPS: 100.0000 mg | ORAL_CAPSULE | Freq: Two times a day (BID) | ORAL | 0 refills | Status: AC

## 2018-11-09 MED ORDER — SUCCINYLCHOLINE CHLORIDE 200 MG/10ML IV SOSY
PREFILLED_SYRINGE | INTRAVENOUS | Status: AC
  Filled 2018-11-09: qty 10

## 2018-11-09 MED ORDER — GABAPENTIN 300 MG PO CAPS
300.0000 mg | ORAL_CAPSULE | ORAL | Status: AC
  Administered 2018-11-09: 300 mg via ORAL
  Filled 2018-11-09: qty 1

## 2018-11-09 MED ORDER — EPHEDRINE 5 MG/ML INJ
INTRAVENOUS | Status: AC
  Filled 2018-11-09: qty 10

## 2018-11-09 MED ORDER — SODIUM CHLORIDE 0.9% FLUSH: 3.0000 mL | Freq: Two times a day (BID) | INTRAVENOUS | Status: DC

## 2018-11-09 MED ORDER — SCOPOLAMINE 1 MG/3DAYS TD PT72
1.0000 | MEDICATED_PATCH | Freq: Once | TRANSDERMAL | Status: DC
  Administered 2018-11-09: 1.5 mg via TRANSDERMAL

## 2018-11-09 MED ORDER — ONDANSETRON HCL 4 MG/2ML IJ SOLN
INTRAMUSCULAR | Status: DC | PRN
  Administered 2018-11-09: 4 mg via INTRAVENOUS

## 2018-11-09 MED ORDER — GLYCOPYRROLATE PF 0.2 MG/ML IJ SOSY
PREFILLED_SYRINGE | INTRAMUSCULAR | Status: AC
  Filled 2018-11-09: qty 1

## 2018-11-09 MED ORDER — HYDROMORPHONE HCL 1 MG/ML IJ SOLN
INTRAMUSCULAR | Status: AC
  Filled 2018-11-09: qty 1

## 2018-11-09 MED ORDER — MIDAZOLAM HCL 2 MG/2ML IJ SOLN
INTRAMUSCULAR | Status: AC
  Filled 2018-11-09: qty 2

## 2018-11-09 MED ORDER — MEPERIDINE HCL 50 MG/ML IJ SOLN: 6.2500 mg | INTRAMUSCULAR | Status: DC | PRN

## 2018-11-09 MED ORDER — HYDROMORPHONE HCL 1 MG/ML IJ SOLN
0.2500 mg | INTRAMUSCULAR | Status: DC | PRN
  Administered 2018-11-09: 0.5 mg via INTRAVENOUS

## 2018-11-09 SURGICAL SUPPLY — 38 items
ADH SKN CLS APL DERMABOND .7 (GAUZE/BANDAGES/DRESSINGS) ×1
APPLIER CLIP ROT 10 11.4 M/L (STAPLE) ×2
APR CLP MED LRG 11.4X10 (STAPLE) ×1
BAG SPEC RTRVL LRG 6X4 10 (ENDOMECHANICALS) ×2
CABLE HIGH FREQUENCY MONO STRZ (ELECTRODE) ×2 IMPLANT
CHLORAPREP W/TINT 26ML (MISCELLANEOUS) ×2 IMPLANT
CLIP APPLIE ROT 10 11.4 M/L (STAPLE) ×1 IMPLANT
COVER MAYO STAND STRL (DRAPES) IMPLANT
COVER SURGICAL LIGHT HANDLE (MISCELLANEOUS) ×2 IMPLANT
COVER WAND RF STERILE (DRAPES) IMPLANT
DECANTER SPIKE VIAL GLASS SM (MISCELLANEOUS) ×2 IMPLANT
DERMABOND ADVANCED (GAUZE/BANDAGES/DRESSINGS) ×1
DERMABOND ADVANCED .7 DNX12 (GAUZE/BANDAGES/DRESSINGS) ×1 IMPLANT
DRAPE C-ARM 42X120 X-RAY (DRAPES) IMPLANT
ELECT REM PT RETURN 15FT ADLT (MISCELLANEOUS) ×2 IMPLANT
EVACUATOR SILICONE 100CC (DRAIN) ×1 IMPLANT
GLOVE BIO SURGEON STRL SZ 6 (GLOVE) ×2 IMPLANT
GLOVE INDICATOR 6.5 STRL GRN (GLOVE) ×2 IMPLANT
GOWN STRL REUS W/TWL LRG LVL3 (GOWN DISPOSABLE) ×2 IMPLANT
GOWN STRL REUS W/TWL XL LVL3 (GOWN DISPOSABLE) ×4 IMPLANT
GRASPER SUT TROCAR 14GX15 (MISCELLANEOUS) ×3 IMPLANT
HEMOSTAT SNOW SURGICEL 2X4 (HEMOSTASIS) ×1 IMPLANT
KIT BASIN OR (CUSTOM PROCEDURE TRAY) ×2 IMPLANT
KIT TURNOVER KIT A (KITS) IMPLANT
NDL INSUFFLATION 14GA 120MM (NEEDLE) ×1 IMPLANT
NEEDLE INSUFFLATION 14GA 120MM (NEEDLE) ×2 IMPLANT
POUCH SPECIMEN RETRIEVAL 10MM (ENDOMECHANICALS) ×3 IMPLANT
SCISSORS LAP 5X35 DISP (ENDOMECHANICALS) ×2 IMPLANT
SET CHOLANGIOGRAPH MIX (MISCELLANEOUS) IMPLANT
SET IRRIG TUBING LAPAROSCOPIC (IRRIGATION / IRRIGATOR) ×2 IMPLANT
SET TUBE SMOKE EVAC HIGH FLOW (TUBING) ×2 IMPLANT
SLEEVE XCEL OPT CAN 5 100 (ENDOMECHANICALS) ×4 IMPLANT
SUT MNCRL AB 4-0 PS2 18 (SUTURE) ×2 IMPLANT
TOWEL OR 17X26 10 PK STRL BLUE (TOWEL DISPOSABLE) ×2 IMPLANT
TOWEL OR NON WOVEN STRL DISP B (DISPOSABLE) IMPLANT
TRAY LAPAROSCOPIC (CUSTOM PROCEDURE TRAY) ×2 IMPLANT
TROCAR BLADELESS OPT 5 100 (ENDOMECHANICALS) ×2 IMPLANT
TROCAR XCEL 12X100 BLDLESS (ENDOMECHANICALS) ×2 IMPLANT

## 2018-11-09 NOTE — Anesthesia Procedure Notes (Signed)
Procedure Name: Intubation Date/Time: 11/09/2018 12:03 PM Performed by: Maxwell Caul, CRNA Pre-anesthesia Checklist: Patient identified, Emergency Drugs available, Suction available and Patient being monitored Patient Re-evaluated:Patient Re-evaluated prior to induction Oxygen Delivery Method: Circle system utilized Preoxygenation: Pre-oxygenation with 100% oxygen Induction Type: IV induction Ventilation: Mask ventilation without difficulty Laryngoscope Size: Mac and 4 Grade View: Grade I Tube type: Oral Tube size: 7.0 mm Number of attempts: 1 Airway Equipment and Method: Patient positioned with wedge pillow and Stylet Placement Confirmation: ETT inserted through vocal cords under direct vision,  positive ETCO2 and breath sounds checked- equal and bilateral Secured at: 22 cm Tube secured with: Tape Dental Injury: Teeth and Oropharynx as per pre-operative assessment

## 2018-11-09 NOTE — Op Note (Signed)
Operative Note  HART WRISLEY 48 y.o. female [id number]  11/09/2018  Surgeon: Berna Bue MD  Assistant: Angelena Form, MD and Estelle Grumbles, MD  Procedure performed: Laparoscopic Cholecystectomy  Preop diagnosis: cholecystitis Post-op diagnosis/intraop findings: same  Specimens: gallbladder  Retained items: 19 fr round blake drain in the gallbladder fossa  EBL: 30cc  Complications: none  Description of procedure: after obtaining informed consent the patient was brought to the operating room and placed supine on the operating room table. General endotracheal anesthesia was initiated, antibiotics were administered, and a formal timeout was performed. The abdomen was prepped and draped in usual sterile fashion. Peritoneal access was gained using a Visiport technique in the left upper quadrant. Insufflation to 15 mmHg ensued without incident and gross inspection demonstrated no injury from our entry. She does have an enlarged fatty liver and there is one omental adhesion at the level of the umbilicus but no other abnormalities. Under direct visualization and after infiltration with local, a supraumbilical, right anterior axillary and right midclavicular 61mm trochars were inserted and a 12 mm trocar was placed in the epigastrium to the right of the falciform ligament. The patient was placed in steep reverse Trendelenburg and the gallbladder was grasped and retracted cephalad. Because of the patient's habitus in addition to her large fatty liver, in addition to the extremely intrahepatic and large gallbladder, visualization and dissection were quite tedious throughout the case. The infundibulum was retracted laterally and careful dissection with the cautery ensued to divide the peritoneum from over the infundibulum and cystic duct. This was done in a strand by strand fashion, only dividing translucent fibers to ensure no injury to underlying structures. The gallbladder infundibulum was somewhat  redundant and tethered and this dissection was quite difficult. Ultimately we were able to identify an anterior and posterior cystic artery branch, each of which was isolated and followed up onto the gallbladder to confirm anatomy, and then clipped and divided. A large lymphatic traveling up onto the gallbladder infundibulum was also clipped and divided. The cystic duct was then able to be visualized and isolated, the remaining gallbladder lifted off of the cystic plate and the liver visualized between with no other structures entering the gallbladder besides the cystic duct which was then clipped and divided. The gallbladder was then dissected off of the liver using cautery, this again was quite difficult as the gallbladder was significantly intrahepatic within an enlarged fatty and somewhat friable liver parenchyma. The gallbladder was ultimately removed, placed in an Endo Catch bag and removed through the epigastric trocar site. Hemostasis on the liver bed was ensured with cautery. The clips were inspected and confirmed to be appropriately placed. Bile which has been spilled from the torn gallbladder during dissection from the liver bed was aspirated, no spilled stones were present. The right upper quadrant was irrigated copiously and aspirated; the effluent was clear and there was no bile leaking from the liver bed or the cystic duct. A piece of Surgicel Jamelle Haring was placed in the gallbladder fossa. A 19 French round Blake drain was placed in the gallbladder fossa, brought out through the right lateral trocar site and secured at the skin with a 2-0 nylon. The epigastric trocar site was closed with 0 Vicryl in the fascia using a laparoscopic suture passer under direct visualization. The abdomen was once again surveyed, hemostasis confirmed. The abdomen was desufflated and the incisions were closed with subcuticular Monocryl and Dermabond. A sterile dressing was applied to the drain site. The patient  was then  awakened, extubated and taken to the recovery room in stable condition.  All counts were correct at the completion of the case.  A assistant surgeon was necessary to aid in exposure in this exceptionally difficult case.

## 2018-11-09 NOTE — Interval H&P Note (Signed)
History and Physical Interval Note:  11/09/2018 9:55 AM  Debra French  has presented today for surgery, with the diagnosis of Cholecystitis.  The various methods of treatment have been discussed with the patient and family. After consideration of risks, benefits and other options for treatment, the patient has consented to  Procedure(s): LAPAROSCOPIC CHOLECYSTECTOMY (N/A) as a surgical intervention.  The patient's history has been reviewed, patient examined, no change in status, stable for surgery.  I have reviewed the patient's chart and labs.  Questions were answered to the patient's satisfaction.     Breton Berns Lollie Sails

## 2018-11-09 NOTE — Transfer of Care (Signed)
Immediate Anesthesia Transfer of Care Note  Patient: Debra French  Procedure(s) Performed: LAPAROSCOPIC CHOLECYSTECTOMY (N/A )  Patient Location: PACU  Anesthesia Type:General  Level of Consciousness: awake, alert  and oriented  Airway & Oxygen Therapy: Patient Spontanous Breathing and Patient connected to face mask oxygen  Post-op Assessment: Report given to RN and Post -op Vital signs reviewed and stable  Post vital signs: Reviewed and stable  Last Vitals:  Vitals Value Taken Time  BP 159/99 11/09/2018  2:45 PM  Temp 37.2 C 11/09/2018  2:45 PM  Pulse 103 11/09/2018  2:47 PM  Resp 27 11/09/2018  2:47 PM  SpO2 100 % 11/09/2018  2:47 PM  Vitals shown include unvalidated device data.  Last Pain:  Vitals:   11/09/18 1012  TempSrc:   PainSc: 0-No pain         Complications: No apparent anesthesia complications

## 2018-11-09 NOTE — Anesthesia Preprocedure Evaluation (Signed)
Anesthesia Evaluation  Patient identified by MRN, date of birth, ID band Patient awake    Reviewed: Allergy & Precautions, NPO status , Patient's Chart, lab work & pertinent test results  History of Anesthesia Complications (+) PONV  Airway Mallampati: II  TM Distance: >3 FB Neck ROM: Full    Dental  (+) Dental Advisory Given   Pulmonary neg pulmonary ROS,    breath sounds clear to auscultation       Cardiovascular (-) hypertension Rhythm:Regular Rate:Normal     Neuro/Psych Anxiety negative neurological ROS     GI/Hepatic Neg liver ROS, GERD  Medicated and Controlled,  Endo/Other  diabetes (glu 119), Oral Hypoglycemic AgentsMorbid obesity  Renal/GU negative Renal ROS     Musculoskeletal   Abdominal (+) + obese,   Peds  Hematology negative hematology ROS (+)   Anesthesia Other Findings   Reproductive/Obstetrics                             Anesthesia Physical Anesthesia Plan  ASA: III  Anesthesia Plan: General   Post-op Pain Management:    Induction: Intravenous  PONV Risk Score and Plan: 4 or greater and Scopolamine patch - Pre-op, Dexamethasone and Ondansetron  Airway Management Planned: Oral ETT  Additional Equipment:   Intra-op Plan:   Post-operative Plan:   Informed Consent: I have reviewed the patients History and Physical, chart, labs and discussed the procedure including the risks, benefits and alternatives for the proposed anesthesia with the patient or authorized representative who has indicated his/her understanding and acceptance.     Dental advisory given  Plan Discussed with: CRNA and Surgeon  Anesthesia Plan Comments:         Anesthesia Quick Evaluation

## 2018-11-09 NOTE — Anesthesia Postprocedure Evaluation (Signed)
Anesthesia Post Note  Patient: Debra French  Procedure(s) Performed: LAPAROSCOPIC CHOLECYSTECTOMY (N/A )     Patient location during evaluation: PACU Anesthesia Type: General Level of consciousness: awake and alert, awake and oriented Pain management: pain level controlled Vital Signs Assessment: post-procedure vital signs reviewed and stable Respiratory status: spontaneous breathing, nonlabored ventilation, respiratory function stable and patient connected to nasal cannula oxygen Cardiovascular status: blood pressure returned to baseline and stable Postop Assessment: no apparent nausea or vomiting Anesthetic complications: no    Last Vitals:  Vitals:   11/09/18 1600 11/09/18 1640  BP: (!) 163/99 (!) 158/93  Pulse: (!) 104 98  Resp: 14 16  Temp: 36.8 C 36.6 C  SpO2: 92% 92%    Last Pain:  Vitals:   11/09/18 1600  TempSrc:   PainSc: 1                  Cecile Hearing

## 2018-11-09 NOTE — Discharge Instructions (Signed)
LAPAROSCOPIC SURGERY: POST OP INSTRUCTIONS ° °###################################################################### ° °EAT °Gradually transition to a high fiber diet with a fiber supplement over the next few weeks after discharge.  Start with a pureed / full liquid diet (see below) ° °WALK °Walk an hour a day.  Control your pain to do that.   ° °CONTROL PAIN °Control pain so that you can walk, sleep, tolerate sneezing/coughing, go up/down stairs. ° °HAVE A BOWEL MOVEMENT DAILY °Keep your bowels regular to avoid problems.  OK to try a laxative to override constipation.  OK to use an antidairrheal to slow down diarrhea.  Call if not better after 2 tries ° °CALL IF YOU HAVE PROBLEMS/CONCERNS °Call if you are still struggling despite following these instructions. °Call if you have concerns not answered by these instructions ° °###################################################################### ° ° ° °1. DIET: Follow a light bland diet the first 24 hours after arrival home, such as soup, liquids, crackers, etc.  Be sure to include lots of fluids daily.  Avoid fast food or heavy meals as your are more likely to get nauseated.  Eat a low fat the next few days after surgery.   ° °2. Take your usually prescribed home medications unless otherwise directed. ° °3. PAIN CONTROL: °a. Pain is best controlled by a usual combination of three different methods TOGETHER: °i. Ice/Heat °ii. Over the counter pain medication °iii. Prescription pain medication °b. Most patients will experience some swelling and bruising around the incisions.  Ice packs or heating pads (30-60 minutes up to 6 times a day) will help. Use ice for the first few days to help decrease swelling and bruising, then switch to heat to help relax tight/sore spots and speed recovery.  Some people prefer to use ice alone, heat alone, alternating between ice & heat.  Experiment to what works for you.  Swelling and bruising can take several weeks to resolve.   °c. It  is helpful to take an over-the-counter pain medication regularly for the first few weeks.  Choose one of the following that works best for you: °i. Naproxen (Aleve, etc)  Two 220mg tabs twice a day °ii. Ibuprofen (Advil, etc) Three 200mg tabs four times a day (every meal & bedtime) °iii. Acetaminophen (Tylenol, etc) 500-650mg four times a day (every meal & bedtime) °d. A  prescription for pain medication (such as oxycodone, hydrocodone, tramadol, gabapentin, methocarbamol, etc) should be given to you upon discharge.  Take your pain medication as prescribed.  °i. If you are having problems/concerns with the prescription medicine (does not control pain, nausea, vomiting, rash, itching, etc), please call us [phone] to see if we need to switch you to a different pain medicine that will work better for you and/or control your side effect better. °ii. If you need a refill on your pain medication, please give us 48 hour notice.  contact your pharmacy.  They will contact our office to request authorization. Prescriptions will not be filled after 5 pm or on week-ends ° °4. Avoid getting constipated.   °a. Between the surgery and the pain medications, it is common to experience some constipation.   °b. Increasing fluid intake and taking a fiber supplement (such as Metamucil, Citrucel, FiberCon, MiraLax, etc) 1-2 times a day regularly will usually help prevent this problem from occurring.   °c. A mild laxative (prune juice, Milk of Magnesia, MiraLax, etc) should be taken according to package directions if there are no bowel movements after 48 hours.   °5. Watch out for   diarrhea.   a. If you have many loose bowel movements, simplify your diet to bland foods & liquids for a few days.   b. Stop any stool softeners and decrease your fiber supplement.   c. Switching to mild anti-diarrheal medications (Kayopectate, Pepto Bismol) can help.   d. If this worsens or does not improve, please call us.  6. Wash / shower every  day.  You may shower over the skin glue which is waterproof.  Continue to shower over incision(s) after the dressing is off.  7. Skin glue will flake off after 2 weeks.  You may leave the incision open to air.  You may replace a dressing/Band-Aid to cover the incision for comfort if you wish.   8. ACTIVITIES as tolerated:   a. You may resume regular (light) daily activities beginning the next day--such as daily self-care, walking, climbing stairs--gradually increasing activities as tolerated.  If you can walk 30 minutes without difficulty, it is safe to try more intense activity such as jogging, treadmill, bicycling, low-impact aerobics, swimming, etc. b. Save the most intensive and strenuous activity for last such as sit-ups, heavy lifting, contact sports, etc  Refrain from any heavy lifting or straining until you are off narcotics for pain control.   c. DO NOT PUSH THROUGH PAIN.  Let pain be your guide: If it hurts to do something, don't do it.  Pain is your body warning you to avoid that activity for another week until the pain goes down. d. You may drive when you are no longer taking prescription pain medication, you can comfortably wear a seatbelt, and you can safely maneuver your car and apply brakes. e. Bonita Quin may have sexual intercourse when it is comfortable.  9. FOLLOW UP in our office a. Please call [facility] at [phone] to set up an appointment to see your surgeon in the office for a follow-up appointment approximately 2-3 weeks after your surgery. b. Make sure that you call for this appointment the day you arrive home to insure a convenient appointment time.  10. IF YOU HAVE DISABILITY OR FAMILY LEAVE FORMS, BRING THEM TO THE OFFICE FOR PROCESSING.  DO NOT GIVE THEM TO YOUR DOCTOR.   WHEN TO CALL us [phone]:  GREEN OR BRIGHT NEON YELLOW FLUID IN THE DRAIN               -JAUNDICE  -Poor pain control -Reactions / problems with new medications (rash/itching, nausea, etc)   -Fever over 101.5 F (38.5 C) -Inability to urinate -Nausea and/or vomiting -Worsening swelling or bruising -Continued bleeding from incision. -Increased pain, redness, or drainage from the incision   The clinic staff is available to answer your questions during regular business hours (8:30am-5pm).  Please dont hesitate to call and ask to speak to one of our nurses for clinical concerns.   If you have a medical emergency, go to the nearest emergency room or call 911.  A surgeon from [facility] is always on call at the [facility], [place] [address], [place], [place]  [address] ? MAIN: [phone] ? TOLL FREE: [phone] ?  FAX [phone] [email or web address]     [facility] Surgical drains are used to remove extra fluid that normally builds up in a surgical wound after surgery. A surgical drain helps to heal a surgical wound.  Active drains. These drains use suction to pull drainage away from the surgical wound. Drainage flows  through a tube to a container outside of the body. It is important to keep the bulb or the drainage container flat (compressed, shaped like a bean, not a bell) at all times, except while you empty it. Flattening the bulb or container creates suction.  A drain is placed during surgery. Immediately after surgery, drainage is usually bright red and a little thicker than water. The drainage may gradually turn yellow or pink and become thinner. It is likely that your health care provider will remove the drain when the drainage stops or when the amount decreases to 1-2 Tbsp (15-30 mL) during a 24-hour period. How to care for your surgical drain It is important to care for your drain to prevent infection. If your drain is placed at your back, or any other hard-to-reach area, ask another person to assist you in performing the following steps:  Keep the skin around the drain dry and  covered with a dressing at all times.  Check your drain area every day for signs of infection. Check for: ? More redness, swelling, or pain. ? Pus or a bad smell. ? Cloudy drainage. Follow instructions from your health care provider about how to take care of your drain and how to change your dressing. Change your dressing at least one time every day. Change it more often if needed to keep the dressing dry. Make sure you: 1. Gather your supplies, including: ? Tape. ? Germ-free cleaning solution (sterile saline). ? Split gauze drain sponge: 4 x 4 inches (10 x 10 cm). ? Gauze square: 4 x 4 inches (10 x 10 cm). 2. Wash your hands with soap and water before you change your dressing. If soap and water are not available, use hand sanitizer. 3. Remove the old dressing. Avoid using scissors to do that. 4. Use sterile saline to clean your skin around the drain. 5. Place the tube through the slit in a drain sponge. Place the drain sponge so that it covers your wound. 6. Place the gauze square or another drain sponge on top of the drain sponge that is on the wound. Make sure the tube is between those layers. 7. Tape the dressing to your skin. 8. If you have an active bulb or Hemovac drain, tape the drainage tube to your skin 1-2 inches (2.5-5 cm) below the place where the tube enters your body. Taping keeps the tube from pulling on any stitches (sutures) that you have. 9. Wash your hands with soap and water. 10. Write down the color of your drainage and how often you change your dressing. How to empty your active bulb or Hemovac drain  1. Make sure that you have a measuring cup that you can empty your drainage into. 2. Wash your hands with soap and water. If soap and water are not available, use hand sanitizer. 3. Gently move your fingers down the tube while squeezing very lightly. This is called stripping the tube. This clears any drainage, clots, or tissue from the tube. ? Do not pull on the  tube. ? You may need to strip the tube several times every day to keep the tube clear. 4. Open the bulb cap or the drain plug. Do not touch the inside of the cap or the bottom of the plug. 5. Empty all of the drainage into the measuring cup. 6. Compress the bulb or the container and replace the cap or the plug. To compress the bulb or the container, squeeze it firmly in the middle while  you close the cap or plug the container. 7. Write down the amount of drainage that you have in each 24-hour period. If you have less than 2 Tbsp (30 mL) of drainage during 24 hours, contact your health care provider. 8. Flush the drainage down the toilet. 9. Wash your hands with soap and water. Contact a health care provider if:  You have more redness, swelling, or pain around your drain area.  The amount of drainage that you have is increasing instead of decreasing.  You have pus or a bad smell coming from your drain area.  You have a fever.  You have drainage that is cloudy.  There is a sudden stop or a sudden decrease in the amount of drainage that you have.  Your tube falls out.  Your active draindoes not stay compressedafter you empty it. Summary  Surgical drains are used to remove extra fluid that normally builds up in a surgical wound after surgery.  Different kinds of surgical drains include active drains and passive drains. Active drains use suction to pull drainage away from the surgical wound, and passive drains allow fluid to drain naturally.  It is important to care for your drain to prevent infection. If your drain is placed at your back, or any other hard-to-reach area, ask another person to assist you.  Contact your health care provider if you have more redness, swelling, or pain around your drain area. This information is not intended to replace advice given to you by your health care provider. Make sure you discuss any questions you have with your health care  provider.   Surgical Drain Record Empty your surgical drain as told by your health care provider. Use this form to write down the amount of fluid that has collected in the drainage container. Bring this form with you to your follow-up visits.   Date __________ Time __________ Amount __________ Date __________ Time __________ Amount __________ Date __________ Time __________ Amount __________ Date __________ Time __________ Amount __________ Date __________ Time __________ Amount __________ Date __________ Time __________ Amount __________ Date __________ Time __________ Amount __________ Date __________ Time __________ Amount __________ Date __________ Time __________ Amount __________ Date __________ Time __________ Amount __________ Date __________ Time __________ Amount __________ Date __________ Time __________ Amount __________ Date __________ Time __________ Amount __________ Date __________ Time __________ Amount __________ Date __________ Time __________ Amount __________ Date __________ Time __________ Amount __________ Date __________ Time __________ Amount __________ Date __________ Time __________ Amount __________ Date __________ Time __________ Amount __________ Date __________ Time __________ Amount __________ Date __________ Time __________ Amount __________     General Anesthesia, Adult, Care After This sheet gives you information about how to care for yourself after your procedure. Your health care provider may also give you more specific instructions. If you have problems or questions, contact your health care provider. What can I expect after the procedure? After the procedure, the following side effects are common:  Pain or discomfort at the IV site.  Nausea.  Vomiting.  Sore throat.  Trouble concentrating.  Feeling cold or chills.  Weak or tired.  Sleepiness and fatigue.  Soreness and body aches. These side effects can affect parts of the  body that were not involved in surgery. Follow these instructions at home:  For at least 24 hours after the procedure:  Have a responsible adult stay with you. It is important to have someone help care for you until you are awake and alert.  Rest as  needed.  Do not: ? Participate in activities in which you could fall or become injured. ? Drive. ? Use heavy machinery. ? Drink alcohol. ? Take sleeping pills or medicines that cause drowsiness. ? Make important decisions or sign legal documents. ? Take care of children on your own. Eating and drinking  Follow any instructions from your health care provider about eating or drinking restrictions.  When you feel hungry, start by eating small amounts of foods that are soft and easy to digest (bland), such as toast. Gradually return to your regular diet.  Drink enough fluid to keep your urine pale yellow.  If you vomit, rehydrate by drinking water, juice, or clear broth. General instructions  If you have sleep apnea, surgery and certain medicines can increase your risk for breathing problems. Follow instructions from your health care provider about wearing your sleep device: ? Anytime you are sleeping, including during daytime naps. ? While taking prescription pain medicines, sleeping medicines, or medicines that make you drowsy.  Return to your normal activities as told by your health care provider. Ask your health care provider what activities are safe for you.  Take over-the-counter and prescription medicines only as told by your health care provider.  If you smoke, do not smoke without supervision.  Keep all follow-up visits as told by your health care provider. This is important. Contact a health care provider if:  You have nausea or vomiting that does not get better with medicine.  You cannot eat or drink without vomiting.  You have pain that does not get better with medicine.  You are unable to pass urine.  You develop  a skin rash.  You have a fever.  You have redness around your IV site that gets worse. Get help right away if:  You have difficulty breathing.  You have chest pain.  You have blood in your urine or stool, or you vomit blood. Summary  After the procedure, it is common to have a sore throat or nausea. It is also common to feel tired.  Have a responsible adult stay with you for the first 24 hours after general anesthesia. It is important to have someone help care for you until you are awake and alert.  When you feel hungry, start by eating small amounts of foods that are soft and easy to digest (bland), such as toast. Gradually return to your regular diet.  Drink enough fluid to keep your urine pale yellow.  Return to your normal activities as told by your health care provider. Ask your health care provider what activities are safe for you. This information is not intended to replace advice given to you by your health care provider. Make sure you discuss any questions you have with your health care provider. Document Released: 11/23/2000 Document Revised: 04/02/2017 Document Reviewed: 04/02/2017 [employer]vier Interactive Patient Education  2019 [employer].

## 2018-11-11 ENCOUNTER — Encounter (HOSPITAL_COMMUNITY): Payer: Self-pay | Admitting: Surgery

## 2019-02-09 ENCOUNTER — Other Ambulatory Visit: Payer: Self-pay | Admitting: Obstetrics and Gynecology

## 2019-02-09 DIAGNOSIS — Z1231 Encounter for screening mammogram for malignant neoplasm of breast: Secondary | ICD-10-CM

## 2019-02-23 ENCOUNTER — Ambulatory Visit
Admission: RE | Admit: 2019-02-23 | Discharge: 2019-02-23 | Disposition: A | Discharge status: Home / Self Care | Payer: Managed Care, Other (non HMO) | Source: Ambulatory Visit | Attending: Obstetrics and Gynecology | Admitting: Obstetrics and Gynecology

## 2019-02-23 ENCOUNTER — Other Ambulatory Visit: Payer: Self-pay

## 2019-02-23 DIAGNOSIS — Z1231 Encounter for screening mammogram for malignant neoplasm of breast: Secondary | ICD-10-CM

## 2019-03-16 ENCOUNTER — Other Ambulatory Visit: Payer: Self-pay

## 2019-03-20 ENCOUNTER — Other Ambulatory Visit: Payer: Self-pay

## 2019-03-20 ENCOUNTER — Encounter: Payer: Self-pay | Admitting: Obstetrics and Gynecology

## 2019-03-20 ENCOUNTER — Ambulatory Visit (INDEPENDENT_AMBULATORY_CARE_PROVIDER_SITE_OTHER): Payer: Managed Care, Other (non HMO) | Admitting: Obstetrics and Gynecology

## 2019-03-20 VITALS — BP 124/78 | HR 72 | Temp 97.7°F | Resp 14 | Ht 68.25 in | Wt 382.0 lb

## 2019-03-20 DIAGNOSIS — Z01419 Encounter for gynecological examination (general) (routine) without abnormal findings: Secondary | ICD-10-CM | POA: Diagnosis not present

## 2019-03-20 DIAGNOSIS — N915 Oligomenorrhea, unspecified: Secondary | ICD-10-CM

## 2019-03-20 NOTE — Progress Notes (Signed)
47 y.o. G1P1 Single80 Caucasian female here for annual exam.    Had her gall bladder removed in March.   She was taking Micronor for oligomenorrhea and EMB showing proliferation.  She was switched to Aygestin due to irregular menses. She stopped the Aygestin in November due to heavy clotting.  Had her menses for the first time since December.  Lots of hot flashes.  She is considering a Mirena IUD.   Is getting furloughed from work every other week.  Gained 40 pounds during the pandemic.   PCP: Guerry Bruinichard Tisovec, MD     Patient's last menstrual period was 03/17/2019.           Sexually active: No.  The current method of family planning is abstinence.    Exercising: No.  The patient does not participate in regular exercise at present. Smoker:  no  Health Maintenance: Pap:  03/14/18 Neg:Neg HR HPV History of abnormal Pap:  Yes, years ago MMG:  02/23/19 BIRADS 1 negative/density a Colonoscopy:  Normal stool card 2019 with PCP Gardasil:   no HIV and Hep C: 03/11/15 Negative Screening Labs:  PCP   reports that she has never smoked. She has never used smokeless tobacco. She reports that she does not drink alcohol or use drugs.  Past Medical History:  Diagnosis Date  . Anxiety   . Diabetes mellitus without complication (HCC)    type 2  . Elevated hemoglobin A1c   . Genital warts   . GERD (gastroesophageal reflux disease)   . Obesity 03/1998  . Pneumonia   . PONV (postoperative nausea and vomiting)   . Reflux   . STD (sexually transmitted disease) 11/27/97   Hx of Chlamydia    Past Surgical History:  Procedure Laterality Date  . CHOLECYSTECTOMY N/A 11/09/2018   Procedure: LAPAROSCOPIC CHOLECYSTECTOMY;  Surgeon: Berna Bueonnor, Chelsea A, MD;  Location: [facility];  Service: General;  Laterality: N/A;  . COLPOSCOPY  05/1998   neg ECC, neg Bx, CIN I  . knee arthroscopic     Right  . TONSILLECTOMY     1998    Current Outpatient Medications  Medication Sig Dispense Refill  .  cetirizine (ZYRTEC) 10 MG tablet Take 10 mg by mouth daily.     . citalopram (CELEXA) 40 MG tablet Take 40 mg by mouth daily.     . famotidine (PEPCID) 20 MG tablet Take 20 mg by mouth daily.    . ferrous sulfate 325 (65 FE) MG EC tablet Take 325 mg by mouth 2 (two) times daily with a meal.     . LORazepam (ATIVAN) 1 MG tablet Take 1 mg by mouth 2 (two) times daily as needed for anxiety.     . metFORMIN (GLUCOPHAGE) 500 MG tablet Take 500 mg by mouth daily with breakfast.    . ONETOUCH DELICA LANCETS 33G MISC TEST BLOOD SUGAR ONCE DAILY  4  . ONETOUCH VERIO test strip TEST BLOOD SUGAR ONCE DAILY  4  . vitamin C (ASCORBIC ACID) 500 MG tablet Take 500 mg by mouth daily.     No current facility-administered medications for this visit.     Family History  Problem Relation Age of Onset  . Heart disease Father   . Hyperlipidemia Father   . Multiple births Maternal Grandmother   . Hypertension Maternal Grandmother   . Stroke Maternal Grandmother   . Multiple births Paternal Grandmother   . Diabetes Paternal Grandmother   . Heart disease Paternal Grandfather   . Heart  attack Paternal Grandfather   . Hyperlipidemia Sister   . Hyperlipidemia Mother        episode of A - fib  . Heart disease Maternal Uncle        A-Fib  . Heart disease Maternal Uncle   . Heart disease Maternal Uncle   . Breast cancer Neg Hx     Review of Systems  Constitutional: Negative.   HENT: Negative.   Eyes: Negative.   Respiratory: Negative.   Cardiovascular: Negative.   Gastrointestinal: Negative.   Endocrine: Negative.   Genitourinary: Negative.   Musculoskeletal: Negative.   Skin: Negative.   Allergic/Immunologic: Negative.   Neurological: Negative.   Hematological: Negative.   Psychiatric/Behavioral: Negative.     Exam:   BP 124/78 (BP Location: Right Arm, Patient Position: Sitting, Cuff Size: Large)   Pulse 72   Temp 97.7 F (36.5 C) (Temporal)   Resp 14   Ht 5' 8.25" (1.734 m)   Wt (!) 382  lb (173.3 kg)   LMP 03/17/2019   BMI 57.66 kg/m     General appearance: alert, cooperative and appears stated age Head: normocephalic, without obvious abnormality, atraumatic Neck: no adenopathy, supple, symmetrical, trachea midline and thyroid normal to inspection and palpation Lungs: clear to auscultation bilaterally Breasts: normal appearance, no masses or tenderness, No nipple retraction or dimpling, No nipple discharge or bleeding, No axillary adenopathy Heart: regular rate and rhythm Abdomen: soft, non-tender; no masses, no organomegaly Extremities: extremities normal, atraumatic, no cyanosis or edema Skin: skin color, texture, turgor normal. No rashes or lesions Lymph nodes: cervical, supraclavicular, and axillary nodes normal. Neurologic: grossly normal  Pelvic: External genitalia:  no lesions              No abnormal inguinal nodes palpated.              Urethra:  normal appearing urethra with no masses, tenderness or lesions              Bartholins and Skenes: normal                 Vagina: normal appearing vagina with normal color and discharge, no lesions              Cervix: no lesions.  Mild amount of blood in the vagina.              Pap taken: No. Bimanual Exam:  Uterus:  normal size, contour, position, consistency, mobility, non-tender              Adnexa: no mass, fullness, tenderness              Rectal exam: Yes.  .  Confirms.              Anus:  normal sphincter tone, no lesions  Chaperone was present for exam.  Assessment:   Well woman visit with normal exam. Remote hx of abnormal pap.  Oligomenorrhea.  Endometrial proliferation on EMB.   Hot flashes. Hx depression.   Plan: Mammogram screening discussed. Self breast awareness reviewed. Pap and HR HPV as above. Guidelines for Calcium, Vitamin D, regular exercise program including cardiovascular and weight bearing exercise. Check FSH, E2, prolactin, TSH, and vit D.  Routine labs and IFOB with  PCP. Return for pelvic US and EMB.  Will likely plan for Mirena IUD.  Brochure on Mirena.  Follow up annually and prn.   After visit summary provided.

## 2019-03-20 NOTE — Patient Instructions (Signed)

## 2019-03-22 ENCOUNTER — Telehealth: Payer: Self-pay | Admitting: Obstetrics and Gynecology

## 2019-03-22 LAB — PROLACTIN: Prolactin: 9.9 ng/mL (ref 4.8–23.3)

## 2019-03-22 LAB — FOLLICLE STIMULATING HORMONE: FSH: 20.7 m[IU]/mL

## 2019-03-22 LAB — VITAMIN D 25 HYDROXY (VIT D DEFICIENCY, FRACTURES): Vit D, 25-Hydroxy: 30.3 ng/mL (ref 30.0–100.0)

## 2019-03-22 LAB — TSH: TSH: 2.88 u[IU]/mL (ref 0.450–4.500)

## 2019-03-22 LAB — ESTRADIOL: Estradiol: 34.9 pg/mL

## 2019-03-22 NOTE — Telephone Encounter (Signed)
Spoke with patient in regards to benefit for sonohysterogram and possible endometrial biopsy. Patient understood information presented. Patient is scheduled 04/06/2019 with Dr Quincy Simmonds. Patient is aware of the appointment date, arrival time and cancellation policy. No further questions.   Forwarding to Dr Quincy Simmonds for final review of scheduling time frame. Patient is agreeable to disposition. Will close encounter

## 2019-04-04 ENCOUNTER — Other Ambulatory Visit: Payer: Self-pay

## 2019-04-06 ENCOUNTER — Other Ambulatory Visit: Payer: Self-pay

## 2019-04-06 ENCOUNTER — Encounter: Payer: Self-pay | Admitting: Obstetrics and Gynecology

## 2019-04-06 ENCOUNTER — Ambulatory Visit (INDEPENDENT_AMBULATORY_CARE_PROVIDER_SITE_OTHER): Payer: Managed Care, Other (non HMO) | Admitting: Obstetrics and Gynecology

## 2019-04-06 ENCOUNTER — Ambulatory Visit (INDEPENDENT_AMBULATORY_CARE_PROVIDER_SITE_OTHER): Payer: Managed Care, Other (non HMO)

## 2019-04-06 VITALS — BP 160/100 | HR 80 | Temp 97.5°F | Resp 14 | Ht 69.0 in | Wt 384.4 lb

## 2019-04-06 DIAGNOSIS — N939 Abnormal uterine and vaginal bleeding, unspecified: Secondary | ICD-10-CM

## 2019-04-06 DIAGNOSIS — N915 Oligomenorrhea, unspecified: Secondary | ICD-10-CM

## 2019-04-06 NOTE — Progress Notes (Signed)
GYNECOLOGY  VISIT   HPI: 48 y.o.   Single  Caucasian  female   G1P1 with Patient's last menstrual period was 03/17/2019.   here for   sonohysterogram and endometrial biopsy.   History taken from office visit on 03/20/19: She was taking Micronor for oligomenorrhea and EMB showing proliferation.  She was switched to Aygestin due to irregular menses. She stopped the Aygestin in November due to heavy clotting.  Had her menses for the first time since December.  Lots of hot flashes.  She is considering a Mirena IUD  400mg  of Ibuprofen given to patient. Lot #[id number], Exp: 07-2020  GYNECOLOGIC HISTORY: Patient's last menstrual period was 03/17/2019. Contraception:  None.  Menopausal hormone therapy:  NA Last mammogram:  BI-RADS1, cat A density 02/23/19. Last pap smear:   Normal, HR HPR negative.        OB History    Gravida  1   Para  1   Term      Preterm      AB      Living  1     SAB      TAB      Ectopic      Multiple      Live Births                 Patient Active Problem List   Diagnosis Date Noted  . Elevated hemoglobin A1c 03/08/2016  . Right ankle pain 09/03/2015  . Achilles tendinitis of right lower extremity 07/18/2013    Past Medical History:  Diagnosis Date  . Anxiety   . Diabetes mellitus without complication (HCC)    type 2  . Elevated hemoglobin A1c   . Genital warts   . GERD (gastroesophageal reflux disease)   . Obesity 03/1998  . Pneumonia   . PONV (postoperative nausea and vomiting)   . Reflux   . STD (sexually transmitted disease) 11/27/97   Hx of Chlamydia    Past Surgical History:  Procedure Laterality Date  . CHOLECYSTECTOMY N/A 11/09/2018   Procedure: LAPAROSCOPIC CHOLECYSTECTOMY;  Surgeon: Berna Bueonnor, Chelsea A, MD;  Location: [facility];  Service: General;  Laterality: N/A;  . COLPOSCOPY  05/1998   neg ECC, neg Bx, CIN I  . knee arthroscopic     Right  . TONSILLECTOMY     1998    Current Outpatient Medications   Medication Sig Dispense Refill  . cetirizine (ZYRTEC) 10 MG tablet Take 10 mg by mouth daily.     . citalopram (CELEXA) 40 MG tablet Take 40 mg by mouth daily.     . famotidine (PEPCID) 20 MG tablet Take 20 mg by mouth daily.    . ferrous sulfate 325 (65 FE) MG EC tablet Take 325 mg by mouth 2 (two) times daily with a meal.     . LORazepam (ATIVAN) 1 MG tablet Take 1 mg by mouth 2 (two) times daily as needed for anxiety.     . metFORMIN (GLUCOPHAGE) 500 MG tablet Take 500 mg by mouth daily with breakfast.    . ONETOUCH DELICA LANCETS 33G MISC TEST BLOOD SUGAR ONCE DAILY  4  . ONETOUCH VERIO test strip TEST BLOOD SUGAR ONCE DAILY  4  . vitamin C (ASCORBIC ACID) 500 MG tablet Take 500 mg by mouth daily.     No current facility-administered medications for this visit.      ALLERGIES: Patient has no known allergies.  Family History  Problem Relation Age of Onset  .  Heart disease Father   . Hyperlipidemia Father   . Multiple births Maternal Grandmother   . Hypertension Maternal Grandmother   . Stroke Maternal Grandmother   . Multiple births Paternal Grandmother   . Diabetes Paternal Grandmother   . Heart disease Paternal Grandfather   . Heart attack Paternal Grandfather   . Hyperlipidemia Sister   . Hyperlipidemia Mother        episode of A - fib  . Heart disease Maternal Uncle        A-Fib  . Heart disease Maternal Uncle   . Heart disease Maternal Uncle   . Breast cancer Neg Hx     Social History   Socioeconomic History  . Marital status: Single    Spouse name: Not on file  . Number of children: Not on file  . Years of education: Not on file  . Highest education level: Not on file  Occupational History  . Not on file  Social Needs  . Financial resource strain: Not on file  . Food insecurity    Worry: Not on file    Inability: Not on file  . Transportation needs    Medical: Not on file    Non-medical: Not on file  Tobacco Use  . Smoking status: Never Smoker  .  Smokeless tobacco: Never Used  Substance and Sexual Activity  . Alcohol use: No    Alcohol/week: 0.0 standard drinks  . Drug use: No  . Sexual activity: Not Currently    Partners: Male    Birth control/protection: Abstinence, None  Lifestyle  . Physical activity    Days per week: Not on file    Minutes per session: Not on file  . Stress: Not on file  Relationships  . Social Musicianconnections    Talks on phone: Not on file    Gets together: Not on file    Attends religious service: Not on file    Active member of club or organization: Not on file    Attends meetings of clubs or organizations: Not on file    Relationship status: Not on file  . Intimate partner violence    Fear of current or ex partner: Not on file    Emotionally abused: Not on file    Physically abused: Not on file    Forced sexual activity: Not on file  Other Topics Concern  . Not on file  Social History Narrative  . Not on file    Review of Systems  All other systems reviewed and are negative.   See HPI.  PHYSICAL EXAMINATION:    BP (!) 160/100 (BP Location: Left Arm, Patient Position: Sitting, Cuff Size: Large)   Pulse 80   Temp (!) 97.5 F (36.4 C) (Skin)   Resp 14   Ht 5\' 9"  (1.753 m)   Wt (!) 384 lb 6.4 oz (174.4 kg)   LMP 03/17/2019   BMI 56.77 kg/m     General appearance: alert, cooperative and appears stated age   Pelvic US Uterus no masses.  EMS 7.11 mm. Ovaries normal.  No free fluid.  Sonohysterogram/EMB Consent for procedures.  Sterile prep with Hibiclens.  Cannula placed and sterile saline injected.  No filling defects.   Reprep with Hibiclens.  Tenaculum to anterior cervical lip.  pipelle placed to 10 cm x 2.  Tissue to pathology.  Minimal EBL. No complications.   Chaperone was present for exam.  ASSESSMENT  Oligomenorrhea.  Abnormal uterine bleeding.  Elevated BP today.  PLAN  Fu EMB.  Instructions and precautions.  Discussion of potential Mirena IUD.  Risks  and benefits reviewed.   Questions invited and answered. Will watch BP.  Was 124/78 at visit on 03/20/19.     An After Visit Summary was printed and given to the patient.  _15_____ minutes face to face time of which over 50% was spent in counseling.

## 2019-04-06 NOTE — Patient Instructions (Signed)

## 2019-04-06 NOTE — Progress Notes (Signed)
Encounter reviewed by Dr. Dmari Schubring Amundson C. Silva.  

## 2019-04-12 ENCOUNTER — Telehealth: Payer: Self-pay | Admitting: *Deleted

## 2019-04-12 DIAGNOSIS — Z3009 Encounter for other general counseling and advice on contraception: Secondary | ICD-10-CM

## 2019-04-12 NOTE — Telephone Encounter (Signed)
-----   Message from Nunzio Cobbs, MD sent at 04/10/2019  1:16 PM EDT ----- Please report results of EMB showing proliferative endometrium and no abnormal cells.  [place] for a Mirena IUD if she would like this.  It will need to go to precert.

## 2019-04-12 NOTE — Telephone Encounter (Signed)
Notes recorded by Burnice Logan, RN on 04/12/2019 at 11:50 AM EDT  Left message to call Sharee Pimple, RN at [facility].

## 2019-04-13 NOTE — Telephone Encounter (Signed)
Spoke with patient, advised as seen below per Dr. Quincy Simmonds. Patient request to proceed with Mirena IUD insertion. Order placed for precert. No contraceptive. Advised patient can schedule with next menses.  Patient verbalizes understanding and is agreeable.   Routing to [employer] and [employer]  for [employer].   Encounter closed.

## 2019-04-13 NOTE — Telephone Encounter (Signed)
Patient is returning a call to Jill. °

## 2019-04-14 ENCOUNTER — Telehealth: Payer: Self-pay | Admitting: Obstetrics and Gynecology

## 2019-04-14 NOTE — Telephone Encounter (Signed)
Call placed to convey benefits for Mirena. °

## 2019-04-17 NOTE — Telephone Encounter (Signed)
Patient returning call to go over benefits. °

## 2019-04-18 NOTE — Telephone Encounter (Signed)
Spoke with patient she understands/agreeable the benefits. Patient to call at onset of cycle.

## 2019-05-25 ENCOUNTER — Telehealth: Payer: Self-pay | Admitting: Obstetrics and Gynecology

## 2019-05-25 NOTE — Telephone Encounter (Signed)
Spoke with patient. Spotting started 9/17, menses is now light flow today, requesting to schedule Mirena IUD insertion. Order placed and discussed at 8/6 OV.  IUD insertion scheduled for 9/25 at 9:30am with Dr. Quincy Simmonds. Advised to take Motrin 800 mg with food and water one hour before procedure. Patient verbalizes understanding.   Routing to provider for final review. Patient is agreeable to disposition. Will close encounter.  Cc: Lerry Liner, Magdalene Patricia

## 2019-05-25 NOTE — Telephone Encounter (Signed)
Patient has started her cycle and is ready to schedule her IUD.

## 2019-05-26 ENCOUNTER — Encounter: Payer: Self-pay | Admitting: Obstetrics and Gynecology

## 2019-05-26 ENCOUNTER — Other Ambulatory Visit: Payer: Self-pay

## 2019-05-26 ENCOUNTER — Ambulatory Visit (INDEPENDENT_AMBULATORY_CARE_PROVIDER_SITE_OTHER): Payer: Managed Care, Other (non HMO) | Admitting: Obstetrics and Gynecology

## 2019-05-26 VITALS — BP 138/76 | HR 76 | Temp 97.3°F | Resp 14 | Ht 69.0 in | Wt 381.0 lb

## 2019-05-26 DIAGNOSIS — Z01812 Encounter for preprocedural laboratory examination: Secondary | ICD-10-CM | POA: Diagnosis not present

## 2019-05-26 DIAGNOSIS — Z30430 Encounter for insertion of intrauterine contraceptive device: Secondary | ICD-10-CM

## 2019-05-26 DIAGNOSIS — Z3009 Encounter for other general counseling and advice on contraception: Secondary | ICD-10-CM | POA: Diagnosis not present

## 2019-05-26 LAB — POCT URINE PREGNANCY: Preg Test, Ur: NEGATIVE

## 2019-05-26 NOTE — Patient Instructions (Addendum)
Intrauterine Device Insertion, Care After  This sheet gives you information about how to care for yourself after your procedure. Your health care provider may also give you more specific instructions. If you have problems or questions, contact your health care provider. What can I expect after the procedure? After the procedure, it is common to have: Cramps and pain in the abdomen. Light bleeding (spotting) or heavier bleeding that is like your menstrual period. This may last for up to a few days. Lower back pain. Dizziness. Headaches. Nausea. Follow these instructions at home: Before resuming sexual activity, check to make sure that you can feel the IUD string(s). You should be able to feel the end of the string(s) below the opening of your cervix. If your IUD string is in place, you may resume sexual activity. If you had a hormonal IUD inserted more than 7 days after your most recent period started, you will need to use a backup method of birth control for 7 days after IUD insertion. Ask your health care provider whether this applies to you. Continue to check that the IUD is still in place by feeling for the string(s) after every menstrual period, or once a month. Take over-the-counter and prescription medicines only as told by your health care provider. Do not drive or use heavy machinery while taking prescription pain medicine. Keep all follow-up visits as told by your health care provider. This is important. Contact a health care provider if: You have bleeding that is heavier or lasts longer than a normal menstrual cycle. You have a fever. You have cramps or abdominal pain that get worse or do not get better with medicine. You develop abdominal pain that is new or is not in the same area of earlier cramping and pain. You feel lightheaded or weak. You have abnormal or bad-smelling discharge from your vagina. You have pain during sexual activity. You have any of the following problems  with your IUD string(s): The string bothers or hurts you or your sexual partner. You cannot feel the string. The string has gotten longer. You can feel the IUD in your vagina. You think you may be pregnant, or you miss your menstrual period. You think you may have an STI (sexually transmitted infection). Get help right away if: You have flu-like symptoms. You have a fever and chills. You can feel that your IUD has slipped out of place. Summary After the procedure, it is common to have cramps and pain in the abdomen. It is also common to have light bleeding (spotting) or heavier bleeding that is like your menstrual period. Continue to check that the IUD is still in place by feeling for the string(s) after every menstrual period, or once a month. Keep all follow-up visits as told by your health care provider. This is important. Contact your health care provider if you have problems with your IUD string(s), such as the string getting longer or bothering you or your sexual partner. This information is not intended to replace advice given to you by your health care provider. Make sure you discuss any questions you have with your health care provider. Document Released: 04/15/2011 Document Revised: 07/30/2017 Document Reviewed: 07/08/2016 Elsevier Patient Education  2020 [employer] Intrauterine Device Insertion An intrauterine device (IUD) is a medical device that gets inserted into the uterus to prevent pregnancy. It is a small, T-shaped device that has one or two nylon strings hanging down from it. The strings hang out of the lower part of the  uterus (cervix) to allow for future IUD removal. There are two types of IUDs available:  Copper IUD. This type of IUD has copper wire wrapped around it. Copper makes the uterus and fallopian tubes produce a fluid that kills sperm. A copper IUD may last up to 10 years.  Hormone IUD. This type of IUD is made of plastic and contains the hormone progestin  (synthetic progesterone). The hormone thickens mucus in the cervix and prevents sperm from entering the uterus. It also thins the uterine lining to prevent implantation of a fertilized egg. The hormone can weaken or kill the sperm that get into the uterus. A hormone IUD may last 3-5 years. Tell a health care provider about:  Any allergies you have.  All medicines you are taking, including vitamins, herbs, eye drops, creams, and over-the-counter medicines.  Any problems you or family members have had with anesthetic medicines.  Any blood disorders you have.  Any surgeries you have had.  Any medical conditions you have, including any STIs (sexually transmitted infections) you may have.  Whether you are pregnant or may be pregnant. What are the risks? Generally, this is a safe procedure. However, problems may occur, including:  Infection.  Bleeding.  Allergic reactions to medicines.  Accidental puncture (perforation) of the uterus, or damage to other structures or organs.  Accidental placement of the IUD either in the muscle layer of the uterus (myometrium) or outside the uterus.  The IUD falling out of the uterus (expulsion). This is more common among women who have recently had a child.  Pregnancy that happens in the fallopian tube (ectopic pregnancy).  Infection of the uterus and fallopian tubes (pelvic inflammatory disease). What happens before the procedure?  Schedule the IUD insertion for when you will have your menstrual period or right after, to make sure you are not pregnant. Placement of the IUD is better tolerated shortly after a menstrual cycle.  Follow instructions from your health care provider about eating or drinking restrictions.  Ask your health care provider about changing or stopping your regular medicines. This is especially important if you are taking diabetes medicines or blood thinners.  You may get a pain reliever to take before the procedure.  You  may have tests for: ? Pregnancy. A pregnancy test involves having a urine sample taken. ? STIs. Placing an IUD in someone who has an STI can make the infection worse. ? Cervical cancer. You may have a Pap test to check for this type of cancer. This means collecting cells from your cervix to be examined under a microscope.  You may have a physical exam to determine the size and position of your uterus. The procedure may vary among health care providers and hospitals. What happens during the procedure?  A tool (speculum) will be placed in your vagina and widened so that your health care provider can see your cervix.  Medicine may be applied to your cervix to help lower your risk of infection (antiseptic medicine).  You may be given an anesthetic medicine to numb each side of your cervix (intracervical block or paracervical block). This medicine is usually given by an injection into the cervix.  A tool (uterine sound) will be inserted into your uterus to determine the length of your uterus and the direction that your uterus may be tilted.  A slim instrument or tube (IUD inserter) that holds the IUD will be inserted into your vagina, through your cervical canal, and into your uterus.  The IUD  will be placed in the uterus, and the IUD inserter will be removed.  The strings that are attached to the IUD will be trimmed so that they lie just below the cervix. The procedure may vary among health care providers and hospitals. What happens after the procedure?  You may have bleeding after the procedure. This is normal. It varies from light bleeding (spotting) for a few days to menstrual-like bleeding.  You may have cramping and pain.  You may feel dizzy or light-headed.  You may have lower back pain. Summary  An intrauterine device (IUD) is a small, T-shaped device that has one or two nylon strings hanging down from it.  Two types of IUDs are available. You may have a copper IUD or a hormone  IUD.  Schedule the IUD insertion for when you will have your menstrual period or right after, to make sure you are not pregnant. Placement of the IUD is better tolerated shortly after a menstrual cycle.  You may have bleeding after the procedure. This is normal. It varies from light spotting for a few days to menstrual-like bleeding. This information is not intended to replace advice given to you by your health care provider. Make sure you discuss any questions you have with your health care provider. Document Released: 04/15/2011 Document Revised: 07/30/2017 Document Reviewed: 07/08/2016 Elsevier Patient Education  2020 [employer].

## 2019-05-26 NOTE — Progress Notes (Signed)
GYNECOLOGY  VISIT   HPI: 48 y.o.   Single  Caucasian  female   G1P1 with Patient's last menstrual period was 05/25/2019.   here for Mirena IUD insertion. Having heavy flow.  First period in about 7 months.   Patient does have oligomenorrhea and EMB showing proliferation.  She had been on Aygestin in past.   She requests a Mirena IUD.  UPT:   Negative Patient has taken 1000mg  of Ibuprofen at 8:30am  GYNECOLOGIC HISTORY: Patient's last menstrual period was 05/25/2019. Contraception:  Abstinence Menopausal hormone therapy:  none Last mammogram:  BI-RADS1, cat A density 02/23/19. Last pap smear: 03/14/18 Normal, HR HPR negative.        OB History    Gravida  1   Para  1   Term      Preterm      AB      Living  1     SAB      TAB      Ectopic      Multiple      Live Births                 Patient Active Problem List   Diagnosis Date Noted  . Elevated hemoglobin A1c 03/08/2016  . Right ankle pain 09/03/2015  . Achilles tendinitis of right lower extremity 07/18/2013    Past Medical History:  Diagnosis Date  . Anxiety   . Diabetes mellitus without complication (HCC)    type 2  . Elevated hemoglobin A1c   . Genital warts   . GERD (gastroesophageal reflux disease)   . Obesity 03/1998  . Pneumonia   . PONV (postoperative nausea and vomiting)   . Reflux   . STD (sexually transmitted disease) 11/27/97   Hx of Chlamydia    Past Surgical History:  Procedure Laterality Date  . CHOLECYSTECTOMY N/A 11/09/2018   Procedure: LAPAROSCOPIC CHOLECYSTECTOMY;  Surgeon: Berna Bueonnor, Chelsea A, MD;  Location: [facility];  Service: General;  Laterality: N/A;  . COLPOSCOPY  05/1998   neg ECC, neg Bx, CIN I  . knee arthroscopic     Right  . TONSILLECTOMY     1998    Current Outpatient Medications  Medication Sig Dispense Refill  . cetirizine (ZYRTEC) 10 MG tablet Take 10 mg by mouth daily.     . citalopram (CELEXA) 40 MG tablet Take 40 mg by mouth daily.     .  famotidine (PEPCID) 20 MG tablet Take 20 mg by mouth daily.    . ferrous sulfate 325 (65 FE) MG EC tablet Take 325 mg by mouth 2 (two) times daily with a meal.     . LORazepam (ATIVAN) 1 MG tablet Take 1 mg by mouth 2 (two) times daily as needed for anxiety.     . metFORMIN (GLUCOPHAGE) 500 MG tablet Take 500 mg by mouth daily with breakfast.    . ONETOUCH DELICA LANCETS 33G MISC TEST BLOOD SUGAR ONCE DAILY  4  . ONETOUCH VERIO test strip TEST BLOOD SUGAR ONCE DAILY  4  . vitamin C (ASCORBIC ACID) 500 MG tablet Take 500 mg by mouth daily.     No current facility-administered medications for this visit.      ALLERGIES: Patient has no known allergies.  Family History  Problem Relation Age of Onset  . Heart disease Father   . Hyperlipidemia Father   . Multiple births Maternal Grandmother   . Hypertension Maternal Grandmother   . Stroke Maternal Grandmother   .  Multiple births Paternal Grandmother   . Diabetes Paternal Grandmother   . Heart disease Paternal Grandfather   . Heart attack Paternal Grandfather   . Hyperlipidemia Sister   . Hyperlipidemia Mother        episode of A - fib  . Heart disease Maternal Uncle        A-Fib  . Heart disease Maternal Uncle   . Heart disease Maternal Uncle   . Breast cancer Neg Hx     Social History   Socioeconomic History  . Marital status: Single    Spouse name: Not on file  . Number of children: Not on file  . Years of education: Not on file  . Highest education level: Not on file  Occupational History  . Not on file  Social Needs  . Financial resource strain: Not on file  . Food insecurity    Worry: Not on file    Inability: Not on file  . Transportation needs    Medical: Not on file    Non-medical: Not on file  Tobacco Use  . Smoking status: Never Smoker  . Smokeless tobacco: Never Used  Substance and Sexual Activity  . Alcohol use: No    Alcohol/week: 0.0 standard drinks  . Drug use: No  . Sexual activity: Not Currently     Partners: Male    Birth control/protection: Abstinence, None  Lifestyle  . Physical activity    Days per week: Not on file    Minutes per session: Not on file  . Stress: Not on file  Relationships  . Social Herbalist on phone: Not on file    Gets together: Not on file    Attends religious service: Not on file    Active member of club or organization: Not on file    Attends meetings of clubs or organizations: Not on file    Relationship status: Not on file  . Intimate partner violence    Fear of current or ex partner: Not on file    Emotionally abused: Not on file    Physically abused: Not on file    Forced sexual activity: Not on file  Other Topics Concern  . Not on file  Social History Narrative  . Not on file    Review of Systems  Constitutional: Negative.   HENT: Negative.   Eyes: Negative.   Respiratory: Negative.   Cardiovascular: Negative.   Gastrointestinal: Negative.   Endocrine: Negative.   Genitourinary: Negative.   Musculoskeletal: Negative.   Skin: Negative.   Allergic/Immunologic: Negative.   Neurological: Negative.   Hematological: Negative.   Psychiatric/Behavioral: Negative.     PHYSICAL EXAMINATION:    BP 138/76 (BP Location: Right Arm, Patient Position: Sitting, Cuff Size: Large)   Pulse 76   Temp (!) 97.3 F (36.3 C) (Temporal)   Resp 14   Ht 5\' 9"  (1.753 m)   Wt (!) 381 lb (172.8 kg)   LMP 05/25/2019   BMI 56.26 kg/m     General appearance: alert, cooperative and appears stated age    Pelvic: External genitalia:  no lesions              Urethra:  normal appearing urethra with no masses, tenderness or lesions              Bartholins and Skenes: normal                 Vagina: normal appearing vagina with normal color  and discharge, no lesions              Cervix: no lesions.  Menstrual flow noted.                 Bimanual Exam:  Uterus:  normal size, contour, position, consistency, mobility, non-tender               Adnexa: no mass, fullness, tenderness  Mirena IUD insertion.  Consent for procedure.  Lot number [id number], exp Nov 2022. Sterile prep with Hibiclens.  Paracervical block 10 cc 1% lidocaine - lot [id number], exp April 2022. Uterus sounded to 7 cm.  Mirena IUD placed without difficulty.  Strings trimmed.  Repeat bimanual exam, no change.  No complications.  Minimal EBL.          Chaperone was present for exam.  ASSESSMENT  Mirena IUD placement.  Hx oligomenorrhea with benign EMB.  PLAN  Use back up protection for 1 week. Post instructions and precautions given.  IUD card to patient.  FU in 4 weeks.    An After Visit Summary was printed and given to the patient.  ______ minutes face to face time of which over 50% was spent in counseling.

## 2019-06-20 ENCOUNTER — Other Ambulatory Visit: Payer: Self-pay

## 2019-06-21 NOTE — Progress Notes (Signed)
GYNECOLOGY  VISIT   HPI: 48 y.o.   Single  Caucasian  female   G1P1 with Patient's last menstrual period was 06/18/2019 (exact date).   here for 4 week follow up after Mirena IUD insertion.   Spotting since the insertion.  Has a menses also.  Her flow is back to her baseline.  No pain other than one day.   The Mirena is an improvement over the Aygestin she was taking to control bleeding.  She had bleeding for 21 days straight with Aygestin.   Her EMB showed proliferation.   Not sexually active since IUD placed.  Remodeling her house.  GYNECOLOGIC HISTORY: Patient's last menstrual period was 06/18/2019 (exact date). Contraception: Mirena IUD 05-26-19  Menopausal hormone therapy:  none Last mammogram: 02-23-19 neg/density A/Birads1 Last pap smear: 03-14-18 Neg:Neg HR HPV        OB History    Gravida  1   Para  1   Term      Preterm      AB      Living  1     SAB      TAB      Ectopic      Multiple      Live Births                 Patient Active Problem List   Diagnosis Date Noted  . Elevated hemoglobin A1c 03/08/2016  . Right ankle pain 09/03/2015  . Achilles tendinitis of right lower extremity 07/18/2013    Past Medical History:  Diagnosis Date  . Anxiety   . Diabetes mellitus without complication ([place])    type 2  . Elevated hemoglobin A1c   . Genital warts   . GERD (gastroesophageal reflux disease)   . Obesity 03/1998  . Pneumonia   . PONV (postoperative nausea and vomiting)   . Reflux   . STD (sexually transmitted disease) 11/27/97   Hx of Chlamydia    Past Surgical History:  Procedure Laterality Date  . CHOLECYSTECTOMY N/A 11/09/2018   Procedure: LAPAROSCOPIC CHOLECYSTECTOMY;  Surgeon: Clovis Riley, MD;  Location: [facility];  Service: General;  Laterality: N/A;  . COLPOSCOPY  05/1998   neg ECC, neg Bx, CIN I  . knee arthroscopic     Right  . TONSILLECTOMY     1998    Current Outpatient Medications  Medication Sig Dispense  Refill  . cetirizine (ZYRTEC) 10 MG tablet Take 10 mg by mouth daily.     . citalopram (CELEXA) 40 MG tablet Take 40 mg by mouth daily.     . famotidine (PEPCID) 20 MG tablet Take 20 mg by mouth daily.    . ferrous sulfate 325 (65 FE) MG EC tablet Take 325 mg by mouth 2 (two) times daily with a meal.     . LORazepam (ATIVAN) 1 MG tablet Take 1 mg by mouth 2 (two) times daily as needed for anxiety.     . metFORMIN (GLUCOPHAGE) 500 MG tablet Take 500 mg by mouth daily with breakfast.    . ONETOUCH DELICA LANCETS 11H MISC TEST BLOOD SUGAR ONCE DAILY  4  . ONETOUCH VERIO test strip TEST BLOOD SUGAR ONCE DAILY  4  . vitamin C (ASCORBIC ACID) 500 MG tablet Take 500 mg by mouth daily.     No current facility-administered medications for this visit.      ALLERGIES: Patient has no known allergies.  Family History  Problem Relation Age of Onset  .  Heart disease Father   . Hyperlipidemia Father   . Multiple births Maternal Grandmother   . Hypertension Maternal Grandmother   . Stroke Maternal Grandmother   . Multiple births Paternal Grandmother   . Diabetes Paternal Grandmother   . Heart disease Paternal Grandfather   . Heart attack Paternal Grandfather   . Hyperlipidemia Sister   . Hyperlipidemia Mother        episode of A - fib  . Heart disease Maternal Uncle        A-Fib  . Heart disease Maternal Uncle   . Heart disease Maternal Uncle   . Breast cancer Neg Hx     Social History   Socioeconomic History  . Marital status: Single    Spouse name: Not on file  . Number of children: Not on file  . Years of education: Not on file  . Highest education level: Not on file  Occupational History  . Not on file  Social Needs  . Financial resource strain: Not on file  . Food insecurity    Worry: Not on file    Inability: Not on file  . Transportation needs    Medical: Not on file    Non-medical: Not on file  Tobacco Use  . Smoking status: Never Smoker  . Smokeless tobacco: Never  Used  Substance and Sexual Activity  . Alcohol use: No    Alcohol/week: 0.0 standard drinks  . Drug use: No  . Sexual activity: Not Currently    Partners: Male    Birth control/protection: Abstinence, None  Lifestyle  . Physical activity    Days per week: Not on file    Minutes per session: Not on file  . Stress: Not on file  Relationships  . Social Musicianconnections    Talks on phone: Not on file    Gets together: Not on file    Attends religious service: Not on file    Active member of club or organization: Not on file    Attends meetings of clubs or organizations: Not on file    Relationship status: Not on file  . Intimate partner violence    Fear of current or ex partner: Not on file    Emotionally abused: Not on file    Physically abused: Not on file    Forced sexual activity: Not on file  Other Topics Concern  . Not on file  Social History Narrative  . Not on file    Review of Systems  All other systems reviewed and are negative.   PHYSICAL EXAMINATION:    BP (!) 144/84 (Cuff Size: Large)   Pulse 84   Temp 97.7 F (36.5 C) (Temporal)   Ht 5\' 9"  (1.753 m)   Wt (!) 383 lb 12.8 oz (174.1 kg)   LMP 06/18/2019 (Exact Date)   BMI 56.68 kg/m     General appearance: alert, cooperative and appears stated age   Pelvic: External genitalia:  no lesions              Urethra:  normal appearing urethra with no masses, tenderness or lesions              Bartholins and Skenes: normal                 Vagina: normal appearing vagina with normal color and discharge, no lesions              Cervix: no lesions.  IUD strings noted.  Moderate flow.  Bimanual Exam:  Uterus:  normal size, contour, position, consistency, mobility, non-tender              Adnexa: no mass, fullness, tenderness.              Exam limited by [facility].         Chaperone was present for exam.  ASSESSMENT  Mirena IUD check up.  Hx oligomenorrhea and abnormal uterine bleeding.    PLAN  Reassurance regarding IUD position and pregnancy prevention.  Bleeding profiles with Mirena discussed.  Bleeding precautions given.  FU for annual exam and prn.    An After Visit Summary was printed and given to the patient.  _15_____ minutes face to face time of which over 50% was spent in counseling.

## 2019-06-22 ENCOUNTER — Ambulatory Visit (INDEPENDENT_AMBULATORY_CARE_PROVIDER_SITE_OTHER): Payer: Managed Care, Other (non HMO) | Admitting: Obstetrics and Gynecology

## 2019-06-22 ENCOUNTER — Other Ambulatory Visit: Payer: Self-pay

## 2019-06-22 ENCOUNTER — Encounter: Payer: Self-pay | Admitting: Obstetrics and Gynecology

## 2019-06-22 VITALS — BP 144/84 | HR 84 | Temp 97.7°F | Ht 69.0 in | Wt 383.8 lb

## 2019-06-22 DIAGNOSIS — Z30431 Encounter for routine checking of intrauterine contraceptive device: Secondary | ICD-10-CM

## 2020-02-13 ENCOUNTER — Other Ambulatory Visit: Payer: Self-pay | Admitting: Internal Medicine

## 2020-02-13 DIAGNOSIS — Z1231 Encounter for screening mammogram for malignant neoplasm of breast: Secondary | ICD-10-CM

## 2020-02-27 ENCOUNTER — Other Ambulatory Visit: Payer: Self-pay

## 2020-02-27 ENCOUNTER — Ambulatory Visit
Admission: RE | Admit: 2020-02-27 | Discharge: 2020-02-27 | Disposition: A | Discharge status: Home / Self Care | Payer: BC Managed Care – PPO | Source: Ambulatory Visit

## 2020-02-27 DIAGNOSIS — Z1231 Encounter for screening mammogram for malignant neoplasm of breast: Secondary | ICD-10-CM

## 2020-03-08 IMAGING — MG DIGITAL SCREENING BILATERAL MAMMOGRAM WITH TOMO AND CAD
6 of 10 series · 6 of 30 positions shown · non-contrast
Comparison: Previous exam(s).

ACR Breast Density Category a: The breast tissue is almost entirely
fatty.

CLINICAL DATA: Screening.

EXAM:
DIGITAL SCREENING BILATERAL MAMMOGRAM WITH TOMO AND CAD

[L CC synth-2D (1 of 2)]
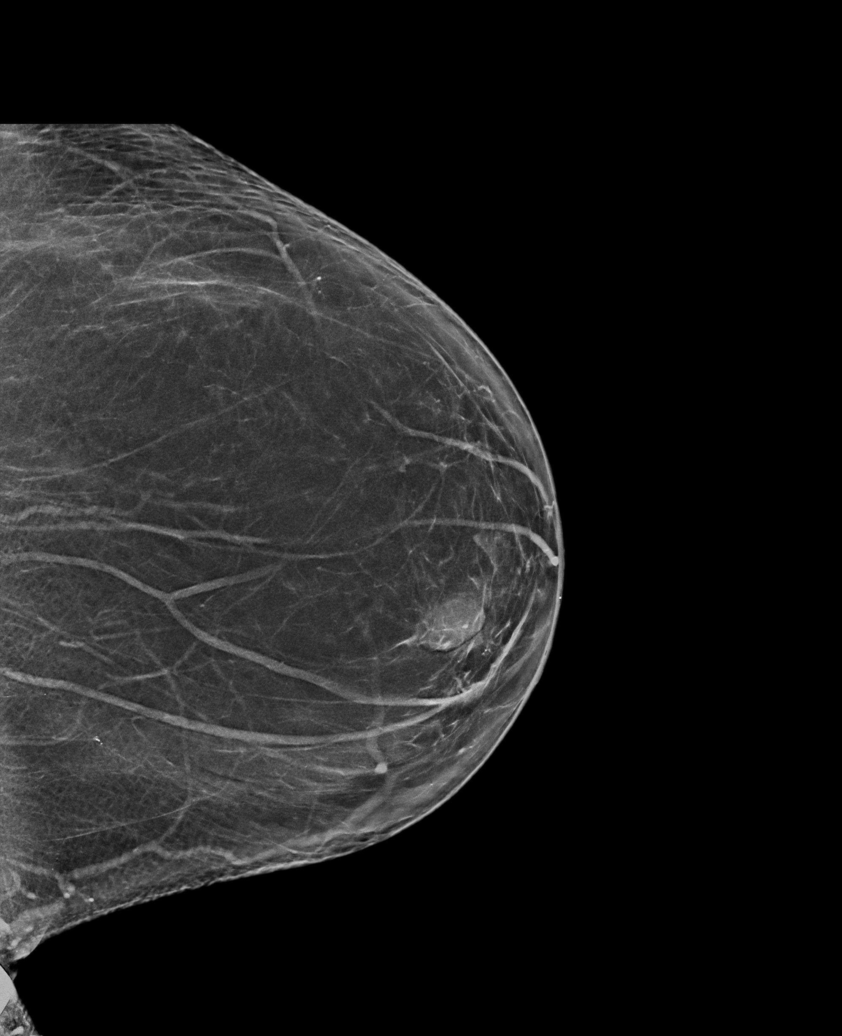

[R MLO synth-2D]
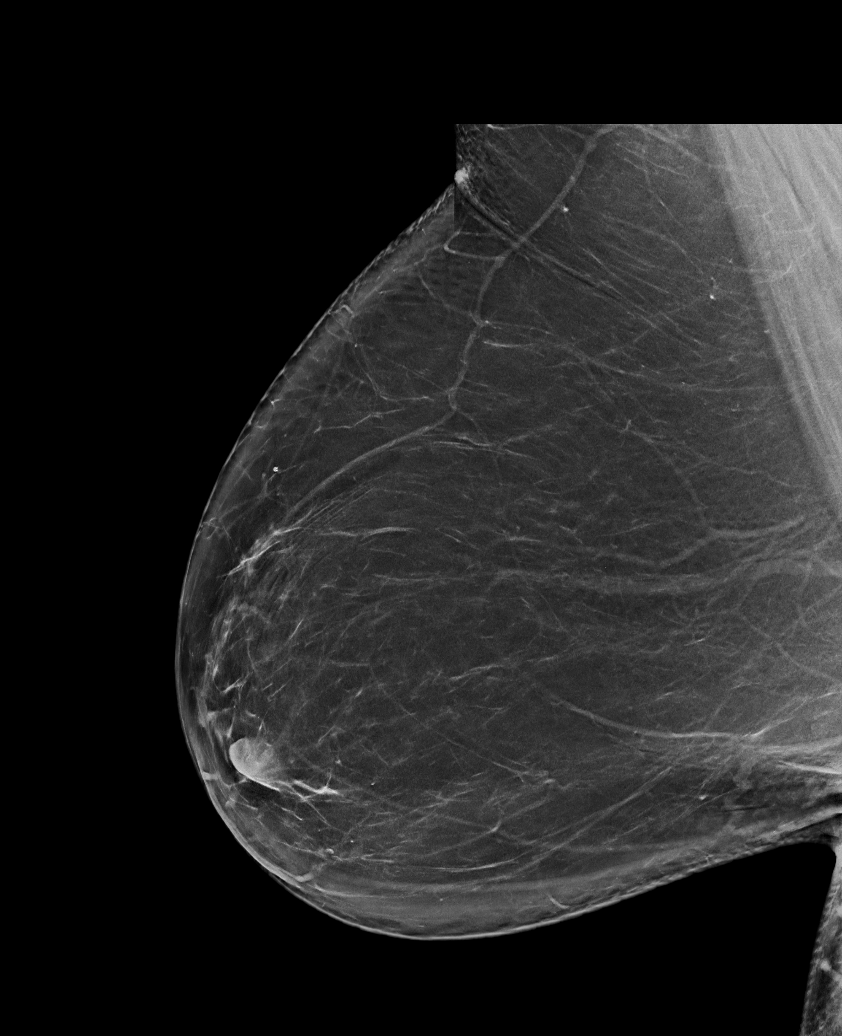

[R CC synth-2D]
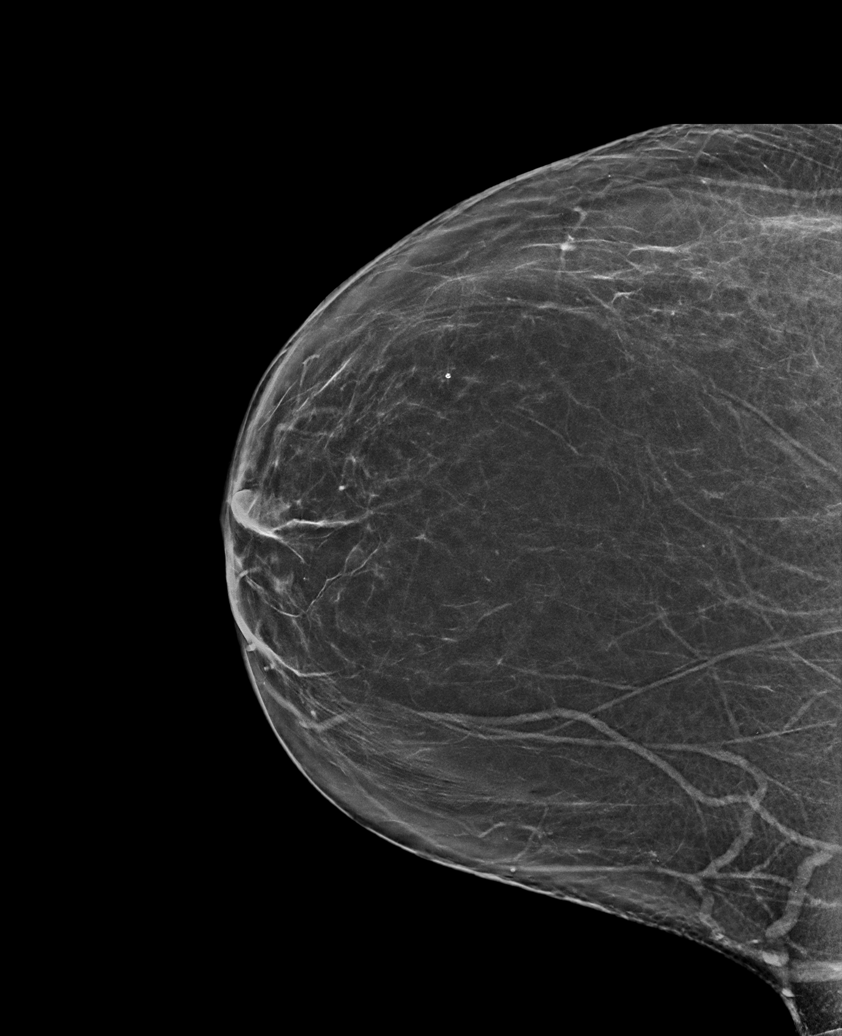

[L MLO synth-2D]
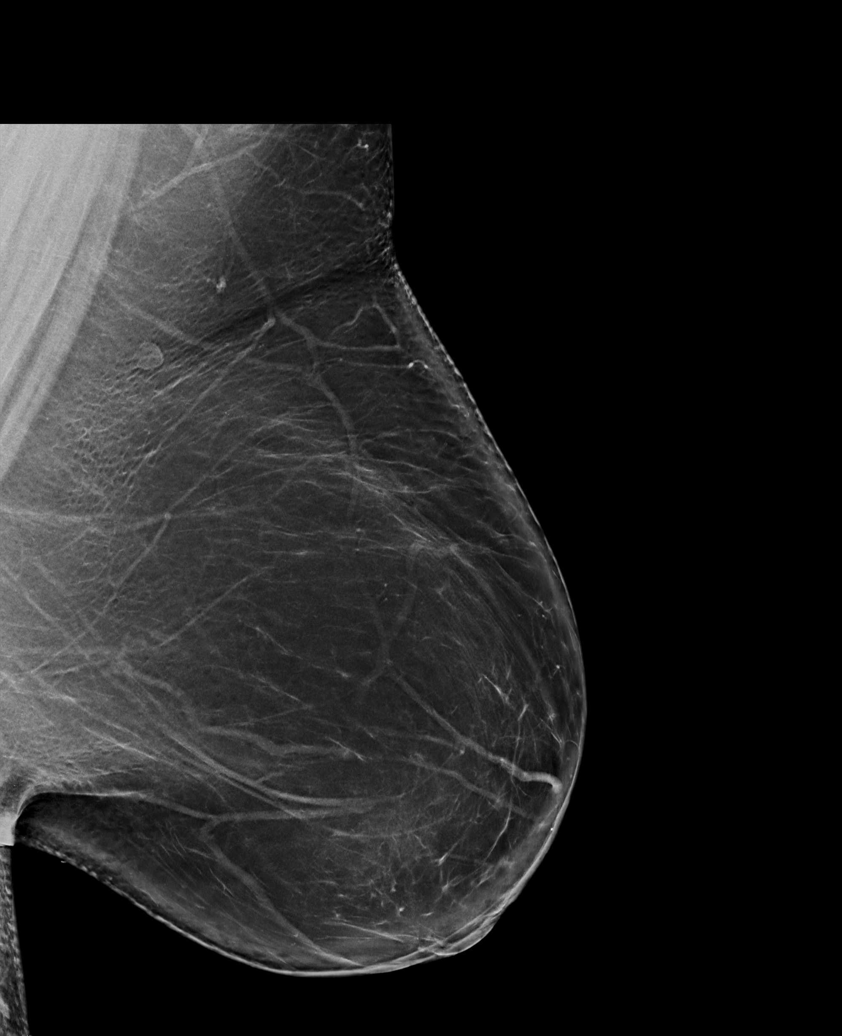

[L CC synth-2D (2 of 2)]
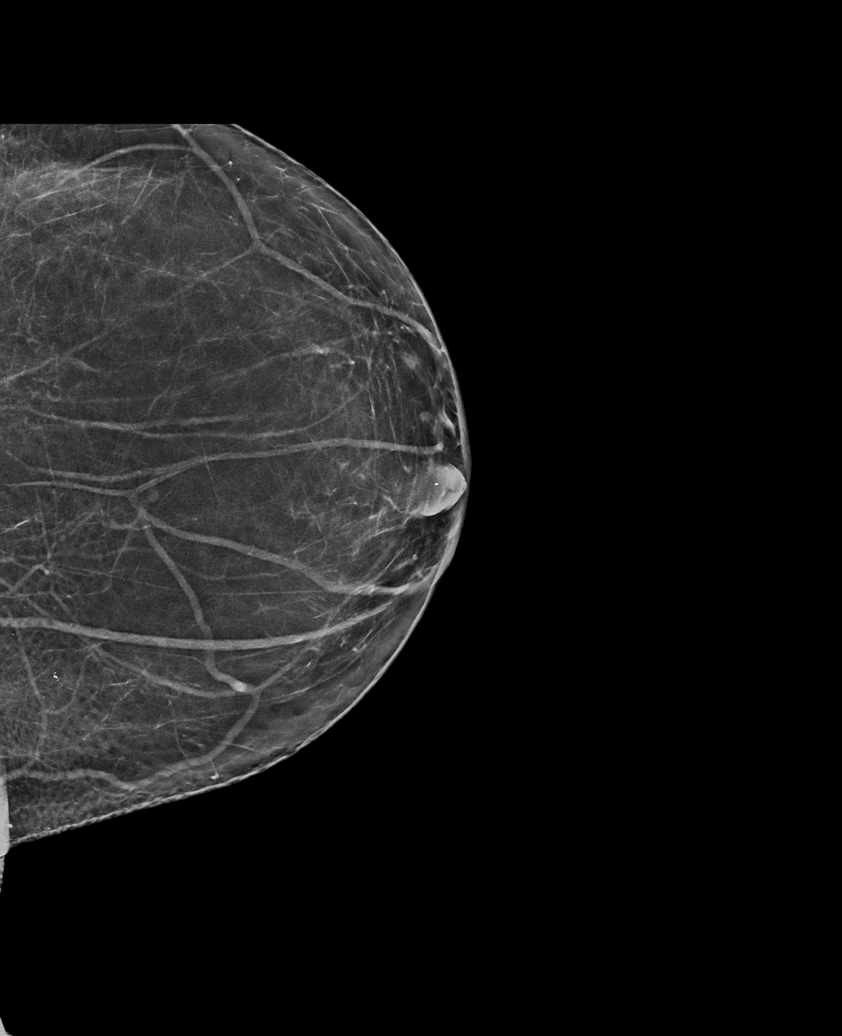

[L CC tomo · tomo slice 35/70.0]
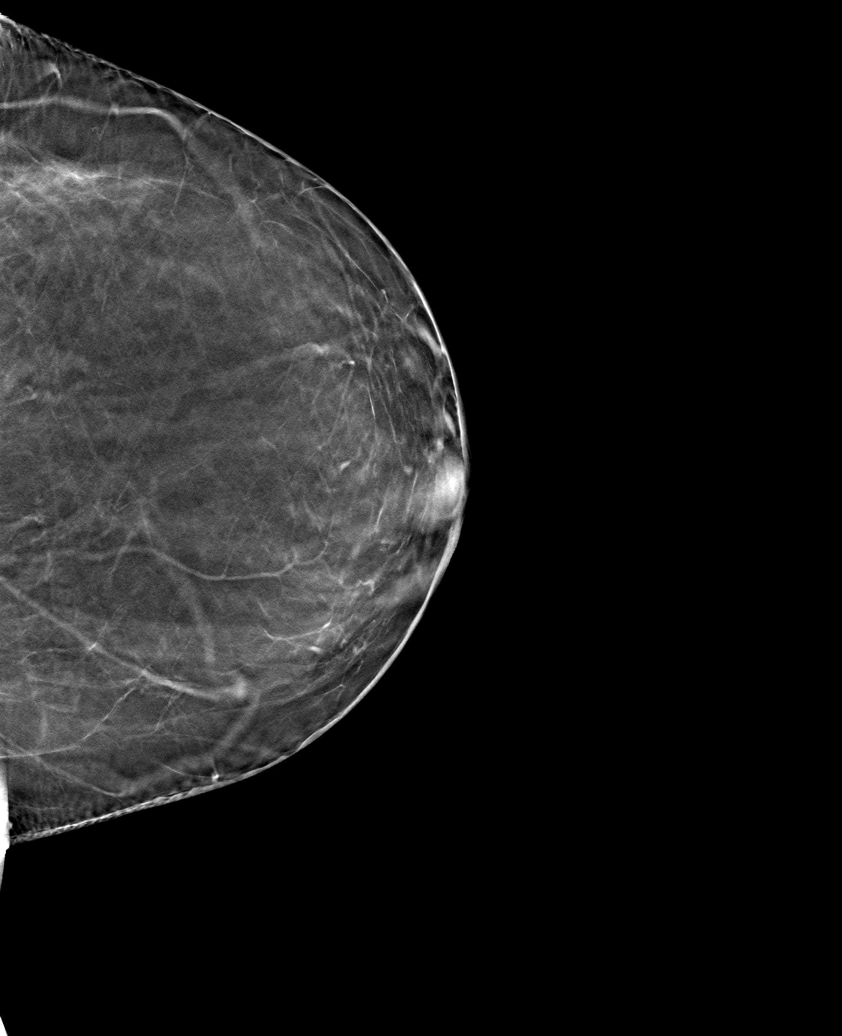

[6 of 30 positions shown; findings below may reference images not displayed]

FINDINGS: There are no findings suspicious for malignancy. Images were
processed with CAD.
IMPRESSION: No mammographic evidence of malignancy. A result letter of this
screening mammogram will be mailed directly to the patient.

RECOMMENDATION:
Screening mammogram in one year. (Code:8Y-[id number])

BI-RADS CATEGORY  1: Negative.

## 2020-03-20 ENCOUNTER — Other Ambulatory Visit: Payer: Self-pay

## 2020-03-20 ENCOUNTER — Encounter: Payer: Self-pay | Admitting: Obstetrics and Gynecology

## 2020-03-20 ENCOUNTER — Ambulatory Visit: Payer: BC Managed Care – PPO | Admitting: Obstetrics and Gynecology

## 2020-03-20 VITALS — BP 120/78 | HR 76 | Resp 14 | Ht 68.0 in | Wt 357.6 lb

## 2020-03-20 DIAGNOSIS — Z23 Encounter for immunization: Secondary | ICD-10-CM

## 2020-03-20 DIAGNOSIS — Z01419 Encounter for gynecological examination (general) (routine) without abnormal findings: Secondary | ICD-10-CM

## 2020-03-20 NOTE — Patient Instructions (Signed)

## 2020-03-20 NOTE — Progress Notes (Signed)
49 y.o. G1P1 Single Caucasian female here for annual exam.    Started [employer] in May.  Lost 30 pounds. Feeling good.  Blood sugar is improved.   No menstruation since October until now.   Sees PCP in September.   Received Covid vaccination in April.   PCP: Guerry Bruin, MD  Patient's last menstrual period was 03/19/2020 (exact date).     Period Cycle (Days):  (no cycles with IUD)     Sexually active: No.  The current method of family planning is IUD--Mirena 05-26-19.    Exercising: No.  The patient does not participate in regular exercise at present. Smoker:  no  Health Maintenance: Pap: 03-14-18 Neg:Neg HR HPV, 02-28-15 Neg:Neg HR HPV, 12-14-11 normal History of abnormal Pap:Yes, years ago had abnormal pap, normal on repeat  MMG: 02-27-20 3D/Neg/density A/Birads1 Colonoscopy:  n/a BMD:   n/a  Result  n/a TDaP: 2011--wants today Gardasil:   no HIV: 03-11-15 NR Hep C: 03-11-15 Neg Screening Labs:  PCP.   reports that she has never smoked. She has never used smokeless tobacco. She reports that she does not drink alcohol and does not use drugs.  Past Medical History:  Diagnosis Date  . Anxiety   . Diabetes mellitus without complication (HCC)    type 2  . Elevated hemoglobin A1c   . Genital warts   . GERD (gastroesophageal reflux disease)   . Obesity 03/1998  . Pneumonia   . PONV (postoperative nausea and vomiting)   . Reflux   . STD (sexually transmitted disease) 11/27/97   Hx of Chlamydia    Past Surgical History:  Procedure Laterality Date  . CHOLECYSTECTOMY N/A 11/09/2018   Procedure: LAPAROSCOPIC CHOLECYSTECTOMY;  Surgeon: Berna Bue, MD;  Location: [facility];  Service: General;  Laterality: N/A;  . COLPOSCOPY  05/1998   neg ECC, neg Bx, CIN I  . knee arthroscopic     Right  . TONSILLECTOMY     1998    Current Outpatient Medications  Medication Sig Dispense Refill  . cetirizine (ZYRTEC) 10 MG tablet Take 10 mg by mouth daily.     . citalopram  (CELEXA) 40 MG tablet Take 40 mg by mouth daily.     . famotidine (PEPCID) 20 MG tablet Take 20 mg by mouth daily.    . ferrous sulfate 325 (65 FE) MG EC tablet Take 325 mg by mouth 2 (two) times daily with a meal.     . LORazepam (ATIVAN) 1 MG tablet Take 1 mg by mouth 2 (two) times daily as needed for anxiety.     . metFORMIN (GLUCOPHAGE) 500 MG tablet Take 500 mg by mouth daily with breakfast.    . ONETOUCH DELICA LANCETS 33G MISC TEST BLOOD SUGAR ONCE DAILY  4  . ONETOUCH VERIO test strip TEST BLOOD SUGAR ONCE DAILY  4  . triamcinolone cream (KENALOG) 0.1 % SMARTSIG:1 Application Topical 2-3 Times Daily    . vitamin C (ASCORBIC ACID) 500 MG tablet Take 500 mg by mouth daily.     No current facility-administered medications for this visit.    Family History  Problem Relation Age of Onset  . Heart disease Father   . Hyperlipidemia Father   . Multiple births Maternal Grandmother   . Hypertension Maternal Grandmother   . Stroke Maternal Grandmother   . Multiple births Paternal Grandmother   . Diabetes Paternal Grandmother   . Heart disease Paternal Grandfather   . Heart attack Paternal Grandfather   .  Hyperlipidemia Sister   . Hyperlipidemia Mother        episode of A - fib  . Heart disease Maternal Uncle        A-Fib  . Heart disease Maternal Uncle   . Heart disease Maternal Uncle   . Breast cancer Neg Hx     Review of Systems  All other systems reviewed and are negative.   Exam:   BP 120/78 (Cuff Size: Large)   Pulse 76   Resp 14   Ht 5\' 8"  (1.727 m)   Wt (!) 357 lb 9.6 oz (162.2 kg)   LMP 03/19/2020 (Exact Date)   BMI 54.37 kg/m     General appearance: alert, cooperative and appears stated age Head: normocephalic, without obvious abnormality, atraumatic Neck: no adenopathy, supple, symmetrical, trachea midline and thyroid normal to inspection and palpation Lungs: clear to auscultation bilaterally Breasts: normal appearance, no masses or tenderness, No nipple  retraction or dimpling, No nipple discharge or bleeding, No axillary adenopathy Heart: regular rate and rhythm Abdomen: soft, non-tender; no masses, no organomegaly Extremities: extremities normal, atraumatic, no cyanosis or edema Skin: skin color, texture, turgor normal. No rashes or lesions Lymph nodes: cervical, supraclavicular, and axillary nodes normal. Neurologic: grossly normal  Pelvic: External genitalia:  no lesions              No abnormal inguinal nodes palpated.              Urethra:  normal appearing urethra with no masses, tenderness or lesions              Bartholins and Skenes: normal                 Vagina: normal appearing vagina with normal color and discharge, no lesions              Cervix: no lesions.  IUD strings noted.  Vaginal bleeding noted.               Pap taken: No. Bimanual Exam:  Uterus:  normal size, contour, position, consistency, mobility, non-tender              Adnexa: no mass, fullness, tenderness              Rectal exam: Yes.  .  Confirms.              Anus:  normal sphincter tone, no lesions  Chaperone was present for exam.  Assessment:   Well woman visit with normal exam. Remote hx of abnormal pap.  Mirena IUD.  Hx endometrial proliferation on EMB.  Menstruation today. DM. Hx depression.   Plan: Mammogram screening discussed. Self breast awareness reviewed. Pap and HR HPV 2024.  Guidelines for Calcium, Vitamin D, regular exercise program including cardiovascular and weight bearing exercise. TDap today.  Follow up annually and prn.   After visit summary provided.

## 2020-07-31 ENCOUNTER — Other Ambulatory Visit: Payer: Self-pay

## 2020-07-31 ENCOUNTER — Ambulatory Visit: Payer: BC Managed Care – PPO | Admitting: Podiatry

## 2020-07-31 ENCOUNTER — Encounter: Payer: Self-pay | Admitting: Podiatry

## 2020-07-31 DIAGNOSIS — Z13228 Encounter for screening for other metabolic disorders: Secondary | ICD-10-CM | POA: Insufficient documentation

## 2020-07-31 DIAGNOSIS — L600 Ingrowing nail: Secondary | ICD-10-CM

## 2020-07-31 NOTE — Patient Instructions (Signed)

## 2020-07-31 NOTE — Progress Notes (Signed)
Subjective:   Patient ID: Debra French, female   DOB: 49 y.o.   MRN: [id number]   HPI Patient presents with painful ingrown toenail deformity of the right big toe.  States that she just finished doxycycline which helped but it still very painful and its been present for a long time.  Patient states the last month is been worse and patient does not smoke likes to be active   Review of Systems  All other systems reviewed and are negative.       Objective:  Physical Exam Vitals and nursing note reviewed.  Constitutional:      Appearance: She is well-developed.  Pulmonary:     Effort: Pulmonary effort is normal.  Musculoskeletal:        General: Normal range of motion.  Skin:    General: Skin is warm.  Neurological:     Mental Status: She is alert.     Neurovascular status intact muscle strength was found to be adequate range of motion adequate.  Patient is found to have incurvated right hallux lateral border that is painful with slight redness no active drainage and irritation of the tissue secondary to the structure of the nailbed.  Patient is obese and has trouble reaching the nailbeds and they do get inflamed around the corners.  Patient has good digital perfusion is well oriented x3 and is very healthy     Assessment:  Chronic ingrown toenail deformity right hallux lateral border with no indications acute infection     Plan:  H&P condition reviewed.  I recommended correction of deformity and explained procedure and risk.  I allowed patient to read consent form she signed understanding risk and today I infiltrated the right hallux 60 mg like Marcaine mixture sterile prep done and using sterile instrumentation I remove the border exposed matrix applied phenol 3 applications 30 seconds followed by alcohol lavage sterile dressing and gave instructions on soaks and leave dressing on 24 hours but take it off earlier if any throbbing were to occur.  Patient is encouraged to call  with any questions concerns which may arise

## 2021-02-17 ENCOUNTER — Other Ambulatory Visit: Payer: Self-pay | Admitting: Internal Medicine

## 2021-02-17 DIAGNOSIS — Z1231 Encounter for screening mammogram for malignant neoplasm of breast: Secondary | ICD-10-CM

## 2021-02-27 ENCOUNTER — Ambulatory Visit
Admission: RE | Admit: 2021-02-27 | Discharge: 2021-02-27 | Disposition: A | Discharge status: Home / Self Care | Payer: BC Managed Care – PPO | Source: Ambulatory Visit

## 2021-02-27 ENCOUNTER — Other Ambulatory Visit: Payer: Self-pay

## 2021-02-27 DIAGNOSIS — Z1231 Encounter for screening mammogram for malignant neoplasm of breast: Secondary | ICD-10-CM

## 2021-04-10 NOTE — Progress Notes (Signed)
50 y.o. G1P1 Single Caucasian female here for annual exam.    No problems.   No periods with Mirena IUD.   Not sexually active in the last year.   Son is in his last year of high school and has been receiving college credits.   PCP:   Guerry Bruin, MD  Patient's last menstrual period was 03/19/2020 (exact date).           Sexually active: No.  The current method of family planning is IUD--Mirena 05-26-19 .    Exercising: No.  The patient does not participate in regular exercise at present. Smoker:  no  Health Maintenance: Pap: 03-14-18 Neg:Neg HR HPV, 02-28-15 Neg:Neg HR HPV, 12-14-11 normal History of abnormal Pap:  no MMG: 02-27-21 3D/Neg/BiRads1 Colonoscopy:  NEVER BMD:   n/a  Result  n/a TDaP: 03-20-20 Gardasil:   no HIV: 03-11-15 NR Hep C: 03-11-15 Neg Screening Labs:  PCP.   reports that she has never smoked. She has never used smokeless tobacco. She reports that she does not drink alcohol and does not use drugs.  Past Medical History:  Diagnosis Date   Anxiety    Diabetes mellitus without complication (HCC)    type 2   Elevated hemoglobin A1c    Genital warts    GERD (gastroesophageal reflux disease)    Obesity 03/1998   Pneumonia    PONV (postoperative nausea and vomiting)    Reflux    STD (sexually transmitted disease) 11/27/97   Hx of Chlamydia    Past Surgical History:  Procedure Laterality Date   CHOLECYSTECTOMY N/A 11/09/2018   Procedure: LAPAROSCOPIC CHOLECYSTECTOMY;  Surgeon: Berna Bue, MD;  Location: [facility];  Service: General;  Laterality: N/A;   COLPOSCOPY  05/1998   neg ECC, neg Bx, CIN I   knee arthroscopic     Right   TONSILLECTOMY     1998    Current Outpatient Medications  Medication Sig Dispense Refill   Calcium Carbonate-Vit D-Min (CALTRATE 600+D PLUS MINERALS) 600-800 MG-UNIT TABS Take 1 tablet by mouth 2 (two) times daily.     cetirizine (ZYRTEC) 10 MG tablet Take 10 mg by mouth daily.      citalopram (CELEXA) 40 MG tablet  Take 1 tablet by mouth daily.     famotidine (PEPCID) 20 MG tablet Take 20 mg by mouth daily.     ferrous sulfate 325 (65 FE) MG tablet 1 tablet po bid     levonorgestrel (MIRENA, 52 MG,) 20 MCG/DAY IUD Per Edward Jolly 05/2019     LORazepam (ATIVAN) 1 MG tablet Take 1 mg by mouth 2 (two) times daily as needed for anxiety.      metFORMIN (GLUCOPHAGE) 500 MG tablet Take 500 mg by mouth daily with breakfast.     ONETOUCH DELICA LANCETS 33G MISC TEST BLOOD SUGAR ONCE DAILY  4   ONETOUCH VERIO test strip TEST BLOOD SUGAR ONCE DAILY  4   rosuvastatin (CRESTOR) 10 MG tablet 1 tablet     triamcinolone cream (KENALOG) 0.1 % SMARTSIG:1 Application Topical 2-3 Times Daily     vitamin C (ASCORBIC ACID) 500 MG tablet Take 500 mg by mouth daily.     No current facility-administered medications for this visit.    Family History  Problem Relation Age of Onset   Heart disease Father    Hyperlipidemia Father    Multiple births Maternal Grandmother    Hypertension Maternal Grandmother    Stroke Maternal Grandmother    Multiple births Paternal  Grandmother    Diabetes Paternal Grandmother    Heart disease Paternal Grandfather    Heart attack Paternal Grandfather    Hyperlipidemia Sister    Hyperlipidemia Mother        episode of A - fib   Heart disease Maternal Uncle        A-Fib   Heart disease Maternal Uncle    Heart disease Maternal Uncle    Breast cancer Neg Hx     Review of Systems  All other systems reviewed and are negative.  Exam:   BP 114/80   Pulse 80   Ht 5\' 8"  (1.727 m)   Wt (!) 361 lb (163.7 kg)   LMP 03/19/2020 (Exact Date)   SpO2 97%   BMI 54.89 kg/m     General appearance: alert, cooperative and appears stated age Head: normocephalic, without obvious abnormality, atraumatic Neck: no adenopathy, supple, symmetrical, trachea midline and thyroid normal to inspection and palpation Lungs: clear to auscultation bilaterally Breasts: normal appearance, no masses or tenderness, No  nipple retraction or dimpling, No nipple discharge or bleeding, No axillary adenopathy Heart: regular rate and rhythm Abdomen: soft, non-tender; no masses, no organomegaly Extremities: extremities normal, atraumatic, no cyanosis or edema Skin: skin color, texture, turgor normal. No rashes or lesions Lymph nodes: cervical, supraclavicular, and axillary nodes normal. Neurologic: grossly normal  Pelvic: External genitalia:  no lesions              No abnormal inguinal nodes palpated.              Urethra:  normal appearing urethra with no masses, tenderness or lesions              Bartholins and Skenes: normal                 Vagina: normal appearing vagina with normal color and discharge, no lesions              Cervix: no lesions.  IUD strings noted.               Pap taken: no. Bimanual Exam:  Uterus:  normal size, contour, position, consistency, mobility, non-tender              Adnexa: no mass, fullness, tenderness              Rectal exam: yes.  Confirms.              Anus:  normal sphincter tone, no lesions  Chaperone was present for exam:  03/21/2020, CMA  Assessment:   Well woman visit with gynecologic exam. Remote hx of abnormal pap.  Mirena IUD.  Hx endometrial proliferation on EMB.   DM. BMI 54.89.  Plan: Mammogram screening discussed. Self breast awareness reviewed. Pap and HR HPV 2024. Guidelines for Calcium, Vitamin D, regular exercise program including cardiovascular and weight bearing exercise. MIrena IUD removal in 2027. Labs with PCP. Colonoscopy next year. Follow up annually and prn.  After visit summary provided.

## 2021-04-16 ENCOUNTER — Encounter: Payer: Self-pay | Admitting: Obstetrics and Gynecology

## 2021-04-16 ENCOUNTER — Other Ambulatory Visit: Payer: Self-pay

## 2021-04-16 ENCOUNTER — Ambulatory Visit (INDEPENDENT_AMBULATORY_CARE_PROVIDER_SITE_OTHER): Payer: BC Managed Care – PPO | Admitting: Obstetrics and Gynecology

## 2021-04-16 VITALS — BP 114/80 | HR 80 | Ht 68.0 in | Wt 361.0 lb

## 2021-04-16 DIAGNOSIS — Z01419 Encounter for gynecological examination (general) (routine) without abnormal findings: Secondary | ICD-10-CM

## 2021-04-16 NOTE — Patient Instructions (Signed)

## 2021-07-14 ENCOUNTER — Encounter: Payer: Self-pay | Admitting: Gastroenterology

## 2021-08-05 ENCOUNTER — Telehealth: Payer: Self-pay | Admitting: *Deleted

## 2021-08-05 NOTE — Telephone Encounter (Signed)
Dr.Beavers,  Patient is scheduled for direct screening colonoscopy. Her last current BMI=54. Hx of diabetes.  Okay for direct hospital colonoscopy or OV? Please advise. Thank you, Glenice Ciccone pv

## 2021-08-06 NOTE — Telephone Encounter (Signed)
Called pt to advise about need to cancel PV and direct colon d/t BMI > 50. LVM requesting returned call.   PV appt 08/19/21 and 12/29 direct colon has been canceled per provider request and per [facility] guidelines. Does not appear this pt is established with Dr. Orvan Falconer, nor any provider with [facility]. Therefore, pt can be scheduled with first available provider. Will await pt returned call to inform her about why her appts have been canceled and to determine when she might want to schedule in 2023.

## 2021-08-07 ENCOUNTER — Encounter: Payer: Self-pay | Admitting: Internal Medicine

## 2021-08-07 ENCOUNTER — Other Ambulatory Visit: Payer: Self-pay | Admitting: Gastroenterology

## 2021-08-07 ENCOUNTER — Other Ambulatory Visit: Payer: Self-pay

## 2021-08-07 DIAGNOSIS — Z1211 Encounter for screening for malignant neoplasm of colon: Secondary | ICD-10-CM

## 2021-08-07 MED ORDER — PLENVU 140 G PO SOLR
1.0000 | ORAL | 0 refills | Status: DC
Start: 2021-08-07 — End: 2022-04-22

## 2021-08-07 NOTE — Telephone Encounter (Signed)
Called pt to make her aware that her appts for PV and [facility] direct colon have been canceled d/t failing to meet criteria. Pt aware she has been scheduled for a direct colon at [facility] on 09/22/21 @ 915am, arrival time 745am with Dr. Christella Hartigan, Virtual PV scheduled 09/16/21 @ 1030am. Verbalized acceptance and understanding. Requested this information including prep instructions be sent to her via My Chart (refer to My Chart encounter). Amb referral placed for auth purposes. Prep Rx also sent to pt preferred pharmacy.

## 2021-08-28 ENCOUNTER — Encounter: Payer: BC Managed Care – PPO | Admitting: Gastroenterology

## 2021-08-28 NOTE — Telephone Encounter (Signed)
Per [facility] end[facility] this pt appt needed to be changed to 1015 am instead of 915 am.  I have called the pt and made her aware of the new time and arrival time.

## 2021-09-11 ENCOUNTER — Encounter (HOSPITAL_COMMUNITY): Payer: Self-pay | Admitting: Gastroenterology

## 2021-09-11 NOTE — Progress Notes (Signed)
Attempted to obtain medical history via telephone, unable to reach at this time. I left a voicemail to return pre surgical testing department's phone call.  

## 2021-09-16 ENCOUNTER — Encounter: Payer: Self-pay | Admitting: Gastroenterology

## 2021-09-16 ENCOUNTER — Ambulatory Visit (AMBULATORY_SURGERY_CENTER): Payer: BC Managed Care – PPO

## 2021-09-16 VITALS — Ht 68.0 in | Wt 365.0 lb

## 2021-09-16 DIAGNOSIS — Z1211 Encounter for screening for malignant neoplasm of colon: Secondary | ICD-10-CM

## 2021-09-16 MED ORDER — PEG 3350-KCL-NA BICARB-NACL 420 G PO SOLR: 4000.0000 mL | Freq: Once | ORAL | 0 refills | Status: AC

## 2021-09-16 NOTE — Progress Notes (Signed)
No egg or soy allergy known to patient  °No issues known to pt with past sedation with any surgeries or procedures °Patient denies ever being told they had issues or difficulty with intubation  °No FH of Malignant Hyperthermia °Pt is not on diet pills °Pt is not on  home 02  °Pt is not on blood thinners  °Pt denies issues with constipation  °No A fib or A flutter ° °Pt is fully vaccinated  for Covid  ° ° NO PA's for preps discussed with pt In PV today  °Discussed with pt there will be an out-of-pocket cost for prep and that varies from $0 to 70 +  dollars - pt verbalized understanding  ° °Due to the COVID-19 pandemic we are asking patients to follow certain guidelines in PV and the [facility]   °Pt aware of COVID protocols and [facility] guidelines  ° °PV completed over the phone. Pt verified name, DOB, address and insurance during PV today.  °Pt mailed instruction packet with copy of consent form to read and not return, and instructions.  °Pt encouraged to call with questions or issues.  °If pt has My chart, procedure instructions sent via My Chart  ° °

## 2021-09-22 ENCOUNTER — Ambulatory Visit (HOSPITAL_COMMUNITY)
Admission: RE | Admit: 2021-09-22 | Discharge: 2021-09-22 | Disposition: A | Discharge status: Home / Self Care | Payer: BC Managed Care – PPO | Source: Ambulatory Visit | Attending: Gastroenterology | Admitting: Gastroenterology

## 2021-09-22 ENCOUNTER — Ambulatory Visit (HOSPITAL_COMMUNITY): Payer: BC Managed Care – PPO | Admitting: Anesthesiology

## 2021-09-22 ENCOUNTER — Encounter (HOSPITAL_COMMUNITY): Payer: Self-pay | Admitting: Gastroenterology

## 2021-09-22 ENCOUNTER — Encounter (HOSPITAL_COMMUNITY)
Admission: RE | Disposition: A | Discharge status: Home / Self Care | Payer: Self-pay | Source: Ambulatory Visit | Attending: Gastroenterology

## 2021-09-22 ENCOUNTER — Other Ambulatory Visit: Payer: Self-pay

## 2021-09-22 DIAGNOSIS — Z1211 Encounter for screening for malignant neoplasm of colon: Secondary | ICD-10-CM | POA: Diagnosis present

## 2021-09-22 DIAGNOSIS — Z6841 Body Mass Index (BMI) 40.0 and over, adult: Secondary | ICD-10-CM | POA: Insufficient documentation

## 2021-09-22 HISTORY — PX: COLONOSCOPY WITH PROPOFOL: SHX5780

## 2021-09-22 LAB — GLUCOSE, CAPILLARY: Glucose-Capillary: 91 mg/dL (ref 70–99)

## 2021-09-22 SURGERY — COLONOSCOPY WITH PROPOFOL
Anesthesia: Monitor Anesthesia Care

## 2021-09-22 MED ORDER — PROPOFOL 1000 MG/100ML IV EMUL
INTRAVENOUS | Status: AC
  Filled 2021-09-22: qty 100

## 2021-09-22 MED ORDER — PROPOFOL 500 MG/50ML IV EMUL
INTRAVENOUS | Status: DC | PRN
  Administered 2021-09-22: 125 ug/kg/min via INTRAVENOUS
  Administered 2021-09-22: 135 ug/kg/min via INTRAVENOUS

## 2021-09-22 MED ORDER — LACTATED RINGERS IV SOLN: INTRAVENOUS | Status: DC

## 2021-09-22 MED ORDER — PROPOFOL 500 MG/50ML IV EMUL
INTRAVENOUS | Status: AC
  Filled 2021-09-22: qty 50

## 2021-09-22 MED ORDER — PROPOFOL 10 MG/ML IV BOLUS
INTRAVENOUS | Status: DC | PRN
  Administered 2021-09-22: 20 mg via INTRAVENOUS
  Administered 2021-09-22 (×2): 10 mg via INTRAVENOUS

## 2021-09-22 SURGICAL SUPPLY — 22 items
ELECT REM PT RETURN 9FT ADLT (ELECTROSURGICAL)
ELECTRODE REM PT RTRN 9FT ADLT (ELECTROSURGICAL) IMPLANT
FCP BXJMBJMB 240X2.8X (CUTTING FORCEPS)
FLOOR PAD 36X40 (MISCELLANEOUS) ×3
FORCEPS BIOP RAD 4 LRG CAP 4 (CUTTING FORCEPS) IMPLANT
FORCEPS BIOP RJ4 240 W/NDL (CUTTING FORCEPS)
FORCEPS BXJMBJMB 240X2.8X (CUTTING FORCEPS) IMPLANT
INJECTOR/SNARE I SNARE (MISCELLANEOUS) IMPLANT
LUBRICANT JELLY 4.5OZ STERILE (MISCELLANEOUS) IMPLANT
MANIFOLD NEPTUNE II (INSTRUMENTS) IMPLANT
NDL SCLEROTHERAPY 25GX240 (NEEDLE) IMPLANT
NEEDLE SCLEROTHERAPY 25GX240 (NEEDLE) IMPLANT
PAD FLOOR 36X40 (MISCELLANEOUS) ×2 IMPLANT
PROBE APC STR FIRE (PROBE) IMPLANT
PROBE INJECTION GOLD (MISCELLANEOUS)
PROBE INJECTION GOLD 7FR (MISCELLANEOUS) IMPLANT
SNARE ROTATE MED OVAL 20MM (MISCELLANEOUS) IMPLANT
SYR 50ML LL SCALE MARK (SYRINGE) IMPLANT
TRAP SPECIMEN MUCOUS 40CC (MISCELLANEOUS) IMPLANT
TUBING ENDO SMARTCAP PENTAX (MISCELLANEOUS) IMPLANT
TUBING IRRIGATION ENDOGATOR (MISCELLANEOUS) ×4 IMPLANT
WATER STERILE IRR 1000ML POUR (IV SOLUTION) IMPLANT

## 2021-09-22 NOTE — Anesthesia Procedure Notes (Signed)
Procedure Name: MAC Date/Time: 09/22/2021 10:09 AM Performed by: Niel Hummer, CRNA Pre-anesthesia Checklist: Emergency Drugs available, Patient identified, Suction available and Patient being monitored Oxygen Delivery Method: Simple face mask

## 2021-09-22 NOTE — H&P (Signed)
HPI: This is a woman at routine risk for colon cancer, increased risk for colonoscopy related complication given morbid obesity  ROS: complete GI ROS as described in HPI, all other review negative.  Constitutional:  No unintentional weight loss   Past Medical History:  Diagnosis Date   Anemia    09/16/21-no longer anemic after IUD was placed   Anxiety    Diabetes mellitus without complication (HCC)    type 2   Elevated hemoglobin A1c    Genital warts    GERD (gastroesophageal reflux disease)    Obesity 03/1998   Pneumonia    PONV (postoperative nausea and vomiting)    Reflux    STD (sexually transmitted disease) 11/27/1997   Hx of Chlamydia    Past Surgical History:  Procedure Laterality Date   CHOLECYSTECTOMY N/A 11/09/2018   Procedure: LAPAROSCOPIC CHOLECYSTECTOMY;  Surgeon: Berna Bue, MD;  Location: [facility];  Service: General;  Laterality: N/A;   COLPOSCOPY  05/1998   neg ECC, neg Bx, CIN I   knee arthroscopic     Right   TONSILLECTOMY     1998    Current Facility-Administered Medications  Medication Dose Route Frequency Provider Last Rate Last Admin   lactated ringers infusion   Intravenous Continuous Rachael Fee, MD        Allergies as of 08/07/2021   (No Known Allergies)    Family History  Problem Relation Age of Onset   Hyperlipidemia Mother        episode of A - fib   Heart disease Father    Hyperlipidemia Father    Hyperlipidemia Sister    Heart disease Maternal Uncle        A-Fib   Heart disease Maternal Uncle    Heart disease Maternal Uncle    Multiple births Maternal Grandmother    Hypertension Maternal Grandmother    Stroke Maternal Grandmother    Multiple births Paternal Grandmother    Diabetes Paternal Grandmother    Heart disease Paternal Grandfather    Heart attack Paternal Grandfather    Breast cancer Neg Hx    Colon cancer Neg Hx    Colon polyps Neg Hx    Esophageal cancer Neg Hx    Stomach cancer Neg Hx    Rectal  cancer Neg Hx     Social History   Socioeconomic History   Marital status: Single    Spouse name: Not on file   Number of children: Not on file   Years of education: Not on file   Highest education level: Not on file  Occupational History   Not on file  Tobacco Use   Smoking status: Never   Smokeless tobacco: Never  Vaping Use   Vaping Use: Never used  Substance and Sexual Activity   Alcohol use: Never   Drug use: Never   Sexual activity: Not Currently    Partners: Male    Birth control/protection: Abstinence, None, I.U.D.    Comment: Mirena IUD 05-26-19  Other Topics Concern   Not on file  Social History Narrative   Not on file   Social Determinants of Health   Financial Resource Strain: Not on file  Food Insecurity: Not on file  Transportation Needs: Not on file  Physical Activity: Not on file  Stress: Not on file  Social Connections: Not on file  Intimate Partner Violence: Not on file     Physical Exam: BP 128/79    Pulse 83    Temp  98.3 F (36.8 C) (Temporal)    Resp 17    Ht 5\' 8"  (1.727 m)    Wt (!) 157.9 kg    SpO2 100%    BMI 52.91 kg/m  Constitutional: generally well-appearing Psychiatric: alert and oriented x3 Lungs: CTA bilaterally Heart: no MCR  Assessment and plan: 51 y.o. female with routine risk for CRC, morbid obesity  Screening colonoscopy today  Care is appropriate for the ambulatory setting.  44, MD  Gastroenterology 09/22/2021, 9:50 AM

## 2021-09-22 NOTE — Op Note (Signed)
[facility] Patient Name: Debra French Procedure Date: 09/22/2021 MRN: [id number] Attending MD: Milus Banister , MD Date of Birth: [birth year] CSN: [id number] Age: 51 Admit Type: Outpatient Procedure:                Colonoscopy Indications:              Screening for colorectal malignant neoplasm Providers:                Milus Banister, MD, Dulcy Fanny, Tyna Jaksch Technician Referring MD:              Medicines:                Monitored Anesthesia Care Complications:            No immediate complications. Estimated blood loss:                            None. Estimated Blood Loss:     Estimated blood loss: none. Procedure:                Pre-Anesthesia Assessment:                           - Prior to the procedure, a History and Physical                            was performed, and patient medications and                            allergies were reviewed. The patient's tolerance of                            previous anesthesia was also reviewed. The risks                            and benefits of the procedure and the sedation                            options and risks were discussed with the patient.                            All questions were answered, and informed consent                            was obtained. Prior Anticoagulants: The patient has                            taken no previous anticoagulant or antiplatelet                            agents. ASA Grade Assessment: IV - A patient with                            severe systemic  disease that is a constant threat                            to life. After reviewing the risks and benefits,                            the patient was deemed in satisfactory condition to                            undergo the procedure.                           After obtaining informed consent, the colonoscope                            was passed under direct vision.  Throughout the                            procedure, the patient's blood pressure, pulse, and                            oxygen saturations were monitored continuously. The                            [id number] Olympus colonoscope was                            introduced through the anus and advanced to the the                            cecum, identified by appendiceal orifice and                            ileocecal valve. The colonoscopy was performed                            without difficulty. The patient tolerated the                            procedure well. The quality of the bowel                            preparation was good. The ileocecal valve,                            appendiceal orifice, and rectum were photographed. Scope In: 10:16:09 AM Scope Out: 10:28:18 AM Scope Withdrawal Time: 0 hours 7 minutes 9 seconds  Total Procedure Duration: 0 hours 12 minutes 9 seconds  Findings:      The entire examined colon appeared normal on direct and retroflexion       views. Impression:               - The entire examined colon is normal on direct and  retroflexion views.                           - No polyps or cancers. Moderate Sedation:      Not Applicable - Patient had care per Anesthesia. Recommendation:           - Patient has a contact number available for                            emergencies. The signs and symptoms of potential                            delayed complications were discussed with the                            patient. Return to normal activities tomorrow.                            Written discharge instructions were provided to the                            patient.                           - Resume previous diet.                           - Continue present medications.                           - Repeat colonoscopy in 10 years for screening. Procedure Code(s):        --- Professional ---                            [phone], Colonoscopy, flexible; diagnostic, including                            collection of specimen(s) by brushing or washing,                            when performed (separate procedure) Diagnosis Code(s):        --- Professional ---                           Z12.11, Encounter for screening for malignant                            neoplasm of colon CPT copyright 2019 [employer]. All rights reserved. The codes documented in this report are preliminary and upon coder review may  be revised to meet current compliance requirements. Milus Banister, MD 09/22/2021 10:32:19 AM This report has been signed electronically. Number of Addenda: 0

## 2021-09-22 NOTE — Discharge Instructions (Signed)

## 2021-09-22 NOTE — Transfer of Care (Signed)
Immediate Anesthesia Transfer of Care Note  Patient: Debra French  Procedure(s) Performed: COLONOSCOPY WITH PROPOFOL  Patient Location: PACU  Anesthesia Type:MAC  Level of Consciousness: awake, alert  and oriented  Airway & Oxygen Therapy: Patient Spontanous Breathing and Patient connected to face mask oxygen  Post-op Assessment: Report given to RN, Post -op Vital signs reviewed and stable and Patient moving all extremities X 4  Post vital signs: Reviewed and stable  Last Vitals:  Vitals Value Taken Time  BP 118/70   Temp    Pulse 76 09/22/21 1033  Resp 14 09/22/21 1033  SpO2 100 % 09/22/21 1033  Vitals shown include unvalidated device data.  Last Pain:  Vitals:   09/22/21 0932  TempSrc: Temporal         Complications: No notable events documented.

## 2021-09-22 NOTE — Anesthesia Postprocedure Evaluation (Signed)
Anesthesia Post Note  Patient: Debra French  Procedure(s) Performed: COLONOSCOPY WITH PROPOFOL     Patient location during evaluation: Endoscopy Anesthesia Type: MAC Level of consciousness: awake and alert Pain management: pain level controlled Vital Signs Assessment: post-procedure vital signs reviewed and stable Respiratory status: spontaneous breathing, nonlabored ventilation, respiratory function stable and patient connected to nasal cannula oxygen Cardiovascular status: blood pressure returned to baseline and stable Postop Assessment: no apparent nausea or vomiting Anesthetic complications: no   No notable events documented.  Last Vitals:  Vitals:   09/22/21 1042 09/22/21 1050  BP: (!) 128/100 115/89  Pulse: 86 96  Resp: 13 (!) 23  Temp:    SpO2: 100% 100%    Last Pain:  Vitals:   09/22/21 1050  TempSrc:   PainSc: 0-No pain                 Latamara Melder DANIEL

## 2021-09-22 NOTE — Anesthesia Preprocedure Evaluation (Addendum)
Anesthesia Evaluation  Patient identified by MRN, date of birth, ID band Patient awake    Reviewed: Allergy & Precautions, NPO status , Patient's Chart, lab work & pertinent test results  History of Anesthesia Complications (+) PONV and history of anesthetic complications  Airway Mallampati: III  TM Distance: >3 FB Neck ROM: Full    Dental no notable dental hx. (+) Dental Advisory Given   Pulmonary neg pulmonary ROS,    Pulmonary exam normal        Cardiovascular (-) hypertensionNormal cardiovascular exam     Neuro/Psych Anxiety negative neurological ROS     GI/Hepatic Neg liver ROS, GERD  Medicated and Controlled,  Endo/Other  diabetes, Oral Hypoglycemic AgentsMorbid obesity  Renal/GU negative Renal ROS     Musculoskeletal   Abdominal (+) + obese,   Peds  Hematology negative hematology ROS (+)   Anesthesia Other Findings   Reproductive/Obstetrics                            Anesthesia Physical  Anesthesia Plan  ASA: 3  Anesthesia Plan: MAC   Post-op Pain Management:    Induction:   PONV Risk Score and Plan: 3 and Ondansetron, Propofol infusion and Treatment may vary due to age or medical condition  Airway Management Planned: Natural Airway  Additional Equipment:   Intra-op Plan:   Post-operative Plan:   Informed Consent: I have reviewed the patients History and Physical, chart, labs and discussed the procedure including the risks, benefits and alternatives for the proposed anesthesia with the patient or authorized representative who has indicated his/her understanding and acceptance.     Dental advisory given  Plan Discussed with: Anesthesiologist and CRNA  Anesthesia Plan Comments:        Anesthesia Quick Evaluation

## 2021-09-23 ENCOUNTER — Encounter (HOSPITAL_COMMUNITY): Payer: Self-pay | Admitting: Gastroenterology

## 2022-01-16 ENCOUNTER — Other Ambulatory Visit: Payer: Self-pay | Admitting: Internal Medicine

## 2022-01-16 DIAGNOSIS — Z1231 Encounter for screening mammogram for malignant neoplasm of breast: Secondary | ICD-10-CM

## 2022-03-02 ENCOUNTER — Ambulatory Visit
Admission: RE | Admit: 2022-03-02 | Discharge: 2022-03-02 | Disposition: A | Discharge status: Home / Self Care | Payer: BC Managed Care – PPO | Source: Ambulatory Visit

## 2022-03-02 DIAGNOSIS — Z1231 Encounter for screening mammogram for malignant neoplasm of breast: Secondary | ICD-10-CM

## 2022-04-21 NOTE — Progress Notes (Unsigned)
51 y.o. G69P1 Single Caucasian female here for annual exam.    PCP:  Domenick Gong, MD   No LMP recorded. (Menstrual status: IUD).           Sexually active: {yes no:314532}  The current method of family planning is IUD--Mirena 05/26/19.    Exercising: {yes no:314532}  {types:19826} Smoker:  no  Health Maintenance: Pap: 03-14-18 Neg:Neg HR HPV, 02-28-15 Neg:Neg HR HPV, 12-14-11 normal History of abnormal Pap:  no MMG:  03-02-22 Neg/BiRads1 Colonoscopy:  09-22-21 normal;10 years BMD:   n/a  Result  n/a TDaP:  03-20-20 Gardasil:   no HIV: 03-11-15 NR Hep C: 03-11-15 Neg Screening Labs:  Hb today: ***, Urine today: ***   reports that she has never smoked. She has never used smokeless tobacco. She reports that she does not drink alcohol and does not use drugs.  Past Medical History:  Diagnosis Date   Anemia    09/16/21-no longer anemic after IUD was placed   Anxiety    Diabetes mellitus without complication (HCC)    type 2   Elevated hemoglobin A1c    Genital warts    GERD (gastroesophageal reflux disease)    Obesity 03/1998   Pneumonia    PONV (postoperative nausea and vomiting)    Reflux    STD (sexually transmitted disease) 11/27/1997   Hx of Chlamydia    Past Surgical History:  Procedure Laterality Date   CHOLECYSTECTOMY N/A 11/09/2018   Procedure: LAPAROSCOPIC CHOLECYSTECTOMY;  Surgeon: Clovis Riley, MD;  Location: [facility];  Service: General;  Laterality: N/A;   COLONOSCOPY WITH PROPOFOL N/A 09/22/2021   Procedure: COLONOSCOPY WITH PROPOFOL;  Surgeon: Milus Banister, MD;  Location: [facility];  Service: Endoscopy;  Laterality: N/A;   COLPOSCOPY  05/1998   neg ECC, neg Bx, CIN I   knee arthroscopic     Right   TONSILLECTOMY     1998    Current Outpatient Medications  Medication Sig Dispense Refill   cetirizine (ZYRTEC) 10 MG tablet Take 10 mg by mouth daily.      citalopram (CELEXA) 40 MG tablet Take 40 mg by mouth daily.     levonorgestrel (MIRENA, 52 MG,)  20 MCG/DAY IUD 1 each by Intrauterine route once.     LORazepam (ATIVAN) 1 MG tablet Take 1 mg by mouth 2 (two) times daily as needed for anxiety.      ONETOUCH DELICA LANCETS 35D MISC TEST BLOOD SUGAR ONCE DAILY  4   ONETOUCH VERIO test strip TEST BLOOD SUGAR ONCE DAILY  4   PEG-KCl-NaCl-NaSulf-Na Asc-C (PLENVU) 140 g SOLR Take 1 kit by mouth as directed. Use coupon: BIN: [id number] PNC: CNRX Group: [id number] ID: [id number] 1 each 0   rosuvastatin (CRESTOR) 10 MG tablet Take 10 mg by mouth daily.     tirzepatide (MOUNJARO) 7.5 MG/0.5ML Pen Inject 7.5 mg into the skin once a week.     No current facility-administered medications for this visit.    Family History  Problem Relation Age of Onset   Hyperlipidemia Mother        episode of A - fib   Heart disease Father    Hyperlipidemia Father    Hyperlipidemia Sister    Heart disease Maternal Uncle        A-Fib   Heart disease Maternal Uncle    Heart disease Maternal Uncle    Multiple births Maternal Grandmother    Hypertension Maternal Grandmother    Stroke Maternal Grandmother  Multiple births Paternal Grandmother    Diabetes Paternal Grandmother    Heart disease Paternal Grandfather    Heart attack Paternal Grandfather    Breast cancer Neg Hx    Colon cancer Neg Hx    Colon polyps Neg Hx    Esophageal cancer Neg Hx    Stomach cancer Neg Hx    Rectal cancer Neg Hx     Review of Systems  Exam:   There were no vitals taken for this visit.    General appearance: alert, cooperative and appears stated age Head: normocephalic, without obvious abnormality, atraumatic Neck: no adenopathy, supple, symmetrical, trachea midline and thyroid normal to inspection and palpation Lungs: clear to auscultation bilaterally Breasts: normal appearance, no masses or tenderness, No nipple retraction or dimpling, No nipple discharge or bleeding, No axillary adenopathy Heart: regular rate and rhythm Abdomen: soft, non-tender; no masses, no  organomegaly Extremities: extremities normal, atraumatic, no cyanosis or edema Skin: skin color, texture, turgor normal. No rashes or lesions Lymph nodes: cervical, supraclavicular, and axillary nodes normal. Neurologic: grossly normal  Pelvic: External genitalia:  no lesions              No abnormal inguinal nodes palpated.              Urethra:  normal appearing urethra with no masses, tenderness or lesions              Bartholins and Skenes: normal                 Vagina: normal appearing vagina with normal color and discharge, no lesions              Cervix: no lesions              Pap taken: {yes no:314532} Bimanual Exam:  Uterus:  normal size, contour, position, consistency, mobility, non-tender              Adnexa: no mass, fullness, tenderness              Rectal exam: {yes no:314532}.  Confirms.              Anus:  normal sphincter tone, no lesions  Chaperone was present for exam:  ***  Assessment:   Well woman visit with gynecologic exam.   Plan: Mammogram screening discussed. Self breast awareness reviewed. Pap and HR HPV as above. Guidelines for Calcium, Vitamin D, regular exercise program including cardiovascular and weight bearing exercise.   Follow up annually and prn.   Additional counseling given.  {yes [id number]. _______ minutes face to face time of which over 50% was spent in counseling.    After visit summary provided.

## 2022-04-22 ENCOUNTER — Encounter: Payer: Self-pay | Admitting: Obstetrics and Gynecology

## 2022-04-22 ENCOUNTER — Ambulatory Visit (INDEPENDENT_AMBULATORY_CARE_PROVIDER_SITE_OTHER): Payer: BC Managed Care – PPO | Admitting: Obstetrics and Gynecology

## 2022-04-22 VITALS — BP 118/70 | HR 94 | Ht 68.0 in | Wt 345.0 lb

## 2022-04-22 DIAGNOSIS — Z01419 Encounter for gynecological examination (general) (routine) without abnormal findings: Secondary | ICD-10-CM | POA: Diagnosis not present

## 2022-04-22 MED ORDER — NYSTATIN 100000 UNIT/GM EX POWD: 1.0000 | Freq: Three times a day (TID) | CUTANEOUS | 2 refills | Status: AC

## 2022-04-22 NOTE — Patient Instructions (Signed)

## 2022-11-18 ENCOUNTER — Other Ambulatory Visit (HOSPITAL_BASED_OUTPATIENT_CLINIC_OR_DEPARTMENT_OTHER): Payer: Self-pay | Admitting: Registered Nurse

## 2022-11-18 ENCOUNTER — Ambulatory Visit (HOSPITAL_BASED_OUTPATIENT_CLINIC_OR_DEPARTMENT_OTHER)
Admission: RE | Admit: 2022-11-18 | Discharge: 2022-11-18 | Disposition: A | Discharge status: Home / Self Care | Payer: BC Managed Care – PPO | Source: Ambulatory Visit | Attending: Registered Nurse | Admitting: Registered Nurse

## 2022-11-18 DIAGNOSIS — M7989 Other specified soft tissue disorders: Secondary | ICD-10-CM | POA: Insufficient documentation

## 2023-01-22 ENCOUNTER — Other Ambulatory Visit: Payer: Self-pay | Admitting: Obstetrics and Gynecology

## 2023-01-22 DIAGNOSIS — Z1231 Encounter for screening mammogram for malignant neoplasm of breast: Secondary | ICD-10-CM

## 2023-03-05 ENCOUNTER — Ambulatory Visit
Admission: RE | Admit: 2023-03-05 | Discharge: 2023-03-05 | Disposition: A | Discharge status: Home / Self Care | Payer: BC Managed Care – PPO | Source: Ambulatory Visit

## 2023-03-05 DIAGNOSIS — Z1231 Encounter for screening mammogram for malignant neoplasm of breast: Secondary | ICD-10-CM

## 2023-05-06 ENCOUNTER — Other Ambulatory Visit: Payer: Self-pay | Admitting: Oncology

## 2023-05-06 DIAGNOSIS — Z006 Encounter for examination for normal comparison and control in clinical research program: Secondary | ICD-10-CM

## 2023-06-11 ENCOUNTER — Other Ambulatory Visit (HOSPITAL_COMMUNITY)
Admission: RE | Admit: 2023-06-11 | Discharge: 2023-06-11 | Disposition: A | Discharge status: Home / Self Care | Payer: BC Managed Care – PPO | Source: Ambulatory Visit | Attending: Oncology | Admitting: Oncology

## 2023-06-11 DIAGNOSIS — Z006 Encounter for examination for normal comparison and control in clinical research program: Secondary | ICD-10-CM | POA: Insufficient documentation

## 2023-06-20 LAB — HELIX MOLECULAR SCREEN: Genetic Analysis Overall Interpretation: NEGATIVE

## 2023-07-27 NOTE — Progress Notes (Unsigned)
52 y.o. G1P1 Single Caucasian female here for annual exam.    Taking Mounjaro through her PCP.  Lost 90 pounds.   No menstrual bleeding.  No hot flashes.   Not sexually active.   PCP: Gaspar Garbe, MD   No LMP recorded. (Menstrual status: IUD).           Sexually active: No.  The current method of family planning is IUD--Mirena 05/26/19.    Menopausal hormone therapy:  n/a Exercising: No.   Smoker:  no  OB History  Gravida Para Term Preterm AB Living  1 1       1   SAB IAB Ectopic Multiple Live Births               # Outcome Date GA Lbr Len/2nd Weight Sex Type Anes PTL Lv  1 Para 2005 [redacted]w[redacted]d   M Vag-Spont        HEALTH MAINTENANCE: Last 2 paps:  03/14/18 neg: HR HPV neg History of abnormal Pap or positive HPV:  no Mammogram:   03/05/23 Breast Density Cat A, BI-RADS CAT 1 neg Colonoscopy:  09/22/21 - due in 10 years.  Bone Density:  n/a  Result  n/a   Immunization History  Administered Date(s) Administered   Tdap 05/01/2010, 03/20/2020      reports that she has never smoked. She has never used smokeless tobacco. She reports that she does not drink alcohol and does not use drugs.  Past Medical History:  Diagnosis Date   Anemia    09/16/21-no longer anemic after IUD was placed   Anxiety    Diabetes mellitus without complication (HCC)    type 2   Elevated hemoglobin A1c    Genital warts    GERD (gastroesophageal reflux disease)    Obesity 03/1998   Pneumonia    PONV (postoperative nausea and vomiting)    Reflux    STD (sexually transmitted disease) 11/27/1997   Hx of Chlamydia    Past Surgical History:  Procedure Laterality Date   CHOLECYSTECTOMY N/A 11/09/2018   Procedure: LAPAROSCOPIC CHOLECYSTECTOMY;  Surgeon: Berna Bue, MD;  Location: [facility];  Service: General;  Laterality: N/A;   COLONOSCOPY WITH PROPOFOL N/A 09/22/2021   Procedure: COLONOSCOPY WITH PROPOFOL;  Surgeon: Rachael Fee, MD;  Location: [facility];  Service: Endoscopy;   Laterality: N/A;   COLPOSCOPY  05/1998   neg ECC, neg Bx, CIN I   knee arthroscopic     Right   TONSILLECTOMY     1998    Current Outpatient Medications  Medication Sig Dispense Refill   cetirizine (ZYRTEC) 10 MG tablet Take 10 mg by mouth daily.      citalopram (CELEXA) 40 MG tablet Take 40 mg by mouth daily.     levonorgestrel (MIRENA, 52 MG,) 20 MCG/DAY IUD 1 each by Intrauterine route once.     LORazepam (ATIVAN) 1 MG tablet Take 1 mg by mouth 2 (two) times daily as needed for anxiety.      MOUNJARO 10 MG/0.5ML Pen Inject into the skin.     Multiple Vitamin (MULTIVITAMIN WITH MINERALS) TABS tablet Take 1 tablet by mouth daily.     nystatin (MYCOSTATIN/NYSTOP) powder Apply 1 Application topically 3 (three) times daily. Apply to affected area for up to 7 days 15 g 2   ONETOUCH DELICA LANCETS 33G MISC TEST BLOOD SUGAR ONCE DAILY  4   ONETOUCH VERIO test strip TEST BLOOD SUGAR ONCE DAILY  4   rosuvastatin (CRESTOR)  10 MG tablet Take 10 mg by mouth daily.     No current facility-administered medications for this visit.    ALLERGIES: Patient has no known allergies.  Family History  Problem Relation Age of Onset   Hyperlipidemia Mother        episode of A - fib   Heart disease Father    Hyperlipidemia Father    Atrial fibrillation Father    Hyperlipidemia Sister    Heart disease Maternal Uncle        A-Fib   Heart disease Maternal Uncle    Heart disease Maternal Uncle    Multiple births Maternal Grandmother    Hypertension Maternal Grandmother    Stroke Maternal Grandmother    Multiple births Paternal Grandmother    Diabetes Paternal Grandmother    Heart disease Paternal Grandfather    Heart attack Paternal Grandfather    Breast cancer Neg Hx    Colon cancer Neg Hx    Colon polyps Neg Hx    Esophageal cancer Neg Hx    Stomach cancer Neg Hx    Rectal cancer Neg Hx     Review of Systems  All other systems reviewed and are negative.   PHYSICAL EXAM:  BP 128/80 (BP  Location: Left Arm, Patient Position: Sitting, Cuff Size: Large)   Pulse 78   Ht 5\' 8"  (1.727 m)   Wt (!) 306 lb (138.8 kg)   SpO2 97%   BMI 46.53 kg/m     General appearance: alert, cooperative and appears stated age Head: normocephalic, without obvious abnormality, atraumatic Neck: no adenopathy, supple, symmetrical, trachea midline and thyroid normal to inspection and palpation Lungs: clear to auscultation bilaterally Breasts: normal appearance, no masses or tenderness, No nipple retraction or dimpling, No nipple discharge or bleeding, No axillary adenopathy Heart: regular rate and rhythm Abdomen: soft, non-tender; no masses, no organomegaly Extremities: extremities normal, atraumatic, no cyanosis or edema Skin: skin color, texture, turgor normal. No rashes or lesions Lymph nodes: cervical, supraclavicular, and axillary nodes normal. Neurologic: grossly normal  Pelvic: External genitalia:  no lesions              No abnormal inguinal nodes palpated.              Urethra:  normal appearing urethra with no masses, tenderness or lesions              Bartholins and Skenes: normal                 Vagina: normal appearing vagina with normal color and discharge, no lesions              Cervix: no lesions.. IUD strings present.              Pap taken: Yes.   Bimanual Exam:  Uterus:  normal size, contour, position, consistency, mobility, non-tender              Adnexa: no mass, fullness, tenderness              Rectal exam: Yes.  .  Confirms.              Anus:  normal sphincter tone, no lesions  Chaperone was present for exam:  Warren Lacy, CMA  ASSESSMENT: Well woman visit with gynecologic exam Remote hx of abnormal pap.  Mirena IUD.  Hx endometrial proliferation on EMB.  No hyperplasia noted.  DM. Successful weight loss.   PLAN: Mammogram screening discussed. Self breast awareness  reviewed. Pap and HRV collected:  Yes.   Guidelines for Calcium, Vitamin D, regular exercise program  including cardiovascular and weight bearing exercise. Medication refills:  NA Labs with PCP. Follow up:  1 year and prn.

## 2023-08-10 ENCOUNTER — Other Ambulatory Visit (HOSPITAL_COMMUNITY)
Admission: RE | Admit: 2023-08-10 | Discharge: 2023-08-10 | Disposition: A | Discharge status: Home / Self Care | Payer: BC Managed Care – PPO | Source: Ambulatory Visit | Attending: Obstetrics and Gynecology | Admitting: Obstetrics and Gynecology

## 2023-08-10 ENCOUNTER — Ambulatory Visit: Payer: BC Managed Care – PPO | Admitting: Obstetrics and Gynecology

## 2023-08-10 ENCOUNTER — Encounter: Payer: Self-pay | Admitting: Obstetrics and Gynecology

## 2023-08-10 VITALS — BP 128/80 | HR 78 | Ht 68.0 in | Wt 306.0 lb

## 2023-08-10 DIAGNOSIS — Z124 Encounter for screening for malignant neoplasm of cervix: Secondary | ICD-10-CM | POA: Insufficient documentation

## 2023-08-10 DIAGNOSIS — Z01419 Encounter for gynecological examination (general) (routine) without abnormal findings: Secondary | ICD-10-CM | POA: Diagnosis not present

## 2023-08-10 NOTE — Patient Instructions (Signed)

## 2023-08-11 LAB — CYTOLOGY - PAP
Comment: NEGATIVE
Diagnosis: NEGATIVE
High risk HPV: NEGATIVE

## 2024-01-28 ENCOUNTER — Other Ambulatory Visit: Payer: Self-pay | Admitting: Internal Medicine

## 2024-01-28 DIAGNOSIS — Z1231 Encounter for screening mammogram for malignant neoplasm of breast: Secondary | ICD-10-CM

## 2024-03-06 ENCOUNTER — Ambulatory Visit
Admission: RE | Admit: 2024-03-06 | Discharge: 2024-03-06 | Disposition: A | Discharge status: Home / Self Care | Source: Ambulatory Visit

## 2024-03-06 DIAGNOSIS — Z1231 Encounter for screening mammogram for malignant neoplasm of breast: Secondary | ICD-10-CM

## 2024-08-15 ENCOUNTER — Encounter: Payer: Self-pay | Admitting: Obstetrics and Gynecology

## 2024-08-15 ENCOUNTER — Ambulatory Visit (INDEPENDENT_AMBULATORY_CARE_PROVIDER_SITE_OTHER): Payer: BC Managed Care – PPO | Admitting: Obstetrics and Gynecology

## 2024-08-15 VITALS — BP 124/84 | HR 85 | Ht 68.0 in | Wt 311.0 lb

## 2024-08-15 DIAGNOSIS — Z01419 Encounter for gynecological examination (general) (routine) without abnormal findings: Secondary | ICD-10-CM | POA: Diagnosis not present

## 2024-08-15 DIAGNOSIS — N912 Amenorrhea, unspecified: Secondary | ICD-10-CM | POA: Diagnosis not present

## 2024-08-15 DIAGNOSIS — Z1331 Encounter for screening for depression: Secondary | ICD-10-CM | POA: Diagnosis not present

## 2024-08-15 NOTE — Progress Notes (Signed)
 53 y.o. G1P1 Single Caucasian female here for annual exam.    Asking about how she will know she is menopausal.  No hot flashes.  Feels warm at night, which is no change.  Taking Celexa.  Not feeling herself. Decreased interest and motivation.  Able to perform at work.   No suicidal.   PCP: Tisovec, Charlie ORN, MD   No LMP recorded. (Menstrual status: IUD).           Sexually active: No.  The current method of family planning is IUD Mirena  IUD 05-26-19.    Menopausal hormone therapy:  n/a Exercising: No.   Smoker:  no  OB History  Gravida Para Term Preterm AB Living  1 1    1   SAB IAB Ectopic Multiple Live Births          # Outcome Date GA Lbr Len/2nd Weight Sex Type Anes PTL Lv  1 Para 2005 [redacted]w[redacted]d   M Vag-Spont        HEALTH MAINTENANCE: Last 2 paps:  08/10/23 neg, HR HPV neg, 03/14/18 neg, HPV neg  History of abnormal Pap or positive HPV:  no Mammogram:   03/06/24 Breast Density Cat A, BIRADS Cat 1 neg  Colonoscopy:  09/22/21 - due in 2033.  Doing annual stool card with PCP.   Bone Density:  n/a  Result  n/a   Immunization History  Administered Date(s) Administered   Tdap 05/01/2010, 03/20/2020      reports that she has never smoked. She has never used smokeless tobacco. She reports that she does not drink alcohol and does not use drugs.  Past Medical History:  Diagnosis Date   Anemia    09/16/21-no longer anemic after IUD was placed   Anxiety    Diabetes mellitus without complication (HCC)    type 2   Elevated hemoglobin A1c    Genital warts    GERD (gastroesophageal reflux disease)    Obesity 03/1998   Pneumonia    PONV (postoperative nausea and vomiting)    Reflux    STD (sexually transmitted disease) 11/27/1997   Hx of Chlamydia    Past Surgical History:  Procedure Laterality Date   CHOLECYSTECTOMY N/A 11/09/2018   Procedure: LAPAROSCOPIC CHOLECYSTECTOMY;  Surgeon: Signe Mitzie LABOR, MD;  Location: [facility];  Service: General;  Laterality: N/A;    COLONOSCOPY WITH PROPOFOL  N/A 09/22/2021   Procedure: COLONOSCOPY WITH PROPOFOL ;  Surgeon: Teressa Toribio SQUIBB, MD;  Location: [facility];  Service: Endoscopy;  Laterality: N/A;   COLPOSCOPY  05/1998   neg ECC, neg Bx, CIN I   knee arthroscopic     Right   TONSILLECTOMY     1998    Current Outpatient Medications  Medication Sig Dispense Refill   cetirizine (ZYRTEC) 10 MG tablet Take 10 mg by mouth daily.      citalopram (CELEXA) 40 MG tablet Take 40 mg by mouth daily.     levonorgestrel  (MIRENA , 52 MG,) 20 MCG/DAY IUD 1 each by Intrauterine route once.     LORazepam (ATIVAN) 1 MG tablet Take 1 mg by mouth 2 (two) times daily as needed for anxiety.      meloxicam (MOBIC) 7.5 MG tablet Take 7.5 mg by mouth daily.     MOUNJARO 15 MG/0.5ML Pen Inject into the skin.     Multiple Vitamin (MULTIVITAMIN WITH MINERALS) TABS tablet Take 1 tablet by mouth daily.     nystatin  (MYCOSTATIN /NYSTOP ) powder Apply 1 Application topically 3 (three) times daily. Apply  to affected area for up to 7 days 15 g 2   ONETOUCH DELICA LANCETS 33G MISC TEST BLOOD SUGAR ONCE DAILY  4   ONETOUCH VERIO test strip TEST BLOOD SUGAR ONCE DAILY  4   rosuvastatin (CRESTOR) 10 MG tablet Take 10 mg by mouth daily.     No current facility-administered medications for this visit.    ALLERGIES: Patient has no known allergies.  Family History  Problem Relation Age of Onset   Hyperlipidemia Mother        episode of A - fib   Heart disease Father    Hyperlipidemia Father    Atrial fibrillation Father    Hyperlipidemia Sister    Heart disease Maternal Uncle        A-Fib   Heart disease Maternal Uncle    Heart disease Maternal Uncle    Multiple births Maternal Grandmother    Hypertension Maternal Grandmother    Stroke Maternal Grandmother    Multiple births Paternal Grandmother    Diabetes Paternal Grandmother    Heart disease Paternal Grandfather    Heart attack Paternal Grandfather    Breast cancer Neg Hx    Colon  cancer Neg Hx    Colon polyps Neg Hx    Esophageal cancer Neg Hx    Stomach cancer Neg Hx    Rectal cancer Neg Hx     Review of Systems  All other systems reviewed and are negative.   PHYSICAL EXAM:  BP 124/84 (BP Location: Right Arm, Patient Position: Sitting)   Pulse 85   Ht 5' 8 (1.727 m)   Wt (!) 311 lb (141.1 kg)   SpO2 97%   BMI 47.29 kg/m     General appearance: alert, cooperative and appears stated age Head: normocephalic, without obvious abnormality, atraumatic Neck: no adenopathy, supple, symmetrical, trachea midline and thyroid normal to inspection and palpation Lungs: clear to auscultation bilaterally Breasts: normal appearance, no masses or tenderness, No nipple retraction or dimpling, No nipple discharge or bleeding, No axillary adenopathy Heart: regular rate and rhythm Abdomen: soft, non-tender; no masses, no organomegaly Extremities: extremities normal, atraumatic, no cyanosis or edema Skin: skin color, texture, turgor normal. No rashes or lesions Lymph nodes: cervical, supraclavicular, and axillary nodes normal. Neurologic: grossly normal  Pelvic: External genitalia:  no lesions              No abnormal inguinal nodes palpated.              Urethra:  normal appearing urethra with no masses, tenderness or lesions              Bartholins and Skenes: normal                 Vagina: normal appearing vagina with normal color and discharge, no lesions              Cervix: no lesions.  IUD strings noted.  Cervical polyp noted 4 mm.              Pap taken: no Bimanual Exam:  Uterus:  normal size, contour, position, consistency, mobility, non-tender              Adnexa: no mass, fullness, tenderness              Rectal exam: yes.  Confirms.              Anus:  normal sphincter tone, no lesions  Chaperone was present for exam:  Darice  C, CMA  ASSESSMENT: Well woman visit with gynecologic exam. Remote hx of abnormal pap.  Amenorrhea.  Mirena  IUD placed 05/26/19.   Hx abnormal uterine bleeding.  Hx endometrial proliferation on EMB prior to placement of IUD.  No hyperplasia noted.  Cervical polyp. DM. Successful weight loss.  PHQ-2-9: 4  PLAN: Mammogram screening discussed. Self breast awareness reviewed. Pap and HRV collected:  no, due in 2029. Guidelines for Calcium, Vitamin D , regular exercise program including cardiovascular and weight bearing exercise. Medication refills:  NA FSH and estradiol .  Will assess for Mirena  IUD exchange. Patient will contact her PCP if her labs show no menopause.  Follow up:  yearly and prn.

## 2024-08-15 NOTE — Patient Instructions (Signed)

## 2024-08-16 LAB — FOLLICLE STIMULATING HORMONE: FSH: 65.7 m[IU]/mL

## 2024-08-26 LAB — ESTRADIOL, ULTRA SENS: Estradiol, Ultra Sensitive: 10 pg/mL

## 2024-08-27 ENCOUNTER — Ambulatory Visit: Payer: Self-pay | Admitting: Obstetrics and Gynecology

## 2024-10-04 ENCOUNTER — Ambulatory Visit: Admitting: Obstetrics and Gynecology

## 2025-08-21 ENCOUNTER — Ambulatory Visit: Admitting: Obstetrics and Gynecology
